# Patient Record
Sex: Female | Born: 1955 | Hispanic: Yes | Marital: Married | Smoking: Never smoker
Health system: Southern US, Community
[De-identification: ages and names within clinical notes are randomized; demographics above are authoritative.]

## PROBLEM LIST (undated history)

## (undated) DIAGNOSIS — E785 Hyperlipidemia, unspecified: Secondary | ICD-10-CM

## (undated) DIAGNOSIS — G43909 Migraine, unspecified, not intractable, without status migrainosus: Secondary | ICD-10-CM

## (undated) HISTORY — DX: Hyperlipidemia, unspecified: E78.5

## (undated) HISTORY — DX: Migraine, unspecified, not intractable, without status migrainosus: G43.909

---

## 2010-12-30 HISTORY — PX: ABDOMINAL HYSTERECTOMY: SHX81

## 2011-06-05 ENCOUNTER — Encounter (HOSPITAL_COMMUNITY): Payer: BC Managed Care – PPO

## 2011-06-05 ENCOUNTER — Other Ambulatory Visit: Payer: Self-pay | Admitting: Obstetrics and Gynecology

## 2011-06-05 LAB — COMPREHENSIVE METABOLIC PANEL
AST: 15 U/L (ref 0–37)
CO2: 28 mEq/L (ref 19–32)
Calcium: 9.2 mg/dL (ref 8.4–10.5)
Creatinine, Ser: 0.63 mg/dL (ref 0.4–1.2)
GFR calc Af Amer: >60 mL/min (ref >60)
GFR calc non Af Amer: >60 mL/min (ref >60)
Sodium: 139 mEq/L (ref 135–145)
Total Protein: 7.1 g/dL (ref 6.0–8.3)

## 2011-06-05 LAB — CBC
MCH: 29.8 pg (ref 26.0–34.0)
MCHC: 31.5 g/dL (ref 30.0–36.0)
Platelets: 323 10*3/uL (ref 150–400)
RDW: 17.2 % — ABNORMAL HIGH (ref 11.5–15.5)

## 2011-06-05 LAB — URINALYSIS, ROUTINE W REFLEX MICROSCOPIC
Bilirubin Urine: NEGATIVE
Glucose, UA: NEGATIVE mg/dL
Nitrite: NEGATIVE
Specific Gravity, Urine: 1.02 (ref 1.005–1.030)
pH: 7 (ref 5.0–8.0)

## 2011-06-05 LAB — SURGICAL PCR SCREEN: MRSA, PCR: NEGATIVE

## 2011-06-11 ENCOUNTER — Inpatient Hospital Stay (HOSPITAL_COMMUNITY)
Admission: RE | Admit: 2011-06-11 | Discharge: 2011-06-13 | DRG: 359 | Disposition: A | Discharge status: Home / Self Care | Payer: BC Managed Care – PPO | Source: Ambulatory Visit | Attending: Obstetrics and Gynecology | Admitting: Obstetrics and Gynecology

## 2011-06-11 ENCOUNTER — Other Ambulatory Visit: Payer: Self-pay | Admitting: Obstetrics and Gynecology

## 2011-06-11 DIAGNOSIS — N8000 Endometriosis of the uterus, unspecified: Secondary | ICD-10-CM | POA: Diagnosis present

## 2011-06-11 DIAGNOSIS — N80209 Endometriosis of unspecified fallopian tube, unspecified depth: Secondary | ICD-10-CM | POA: Diagnosis present

## 2011-06-11 DIAGNOSIS — N840 Polyp of corpus uteri: Secondary | ICD-10-CM | POA: Diagnosis present

## 2011-06-11 DIAGNOSIS — D252 Subserosal leiomyoma of uterus: Principal | ICD-10-CM | POA: Diagnosis present

## 2011-06-11 DIAGNOSIS — N7013 Chronic salpingitis and oophoritis: Secondary | ICD-10-CM | POA: Diagnosis present

## 2011-06-11 DIAGNOSIS — N802 Endometriosis of fallopian tube: Secondary | ICD-10-CM | POA: Diagnosis present

## 2011-06-11 DIAGNOSIS — N920 Excessive and frequent menstruation with regular cycle: Secondary | ICD-10-CM | POA: Diagnosis present

## 2011-06-11 DIAGNOSIS — N393 Stress incontinence (female) (male): Secondary | ICD-10-CM | POA: Diagnosis present

## 2011-06-11 DIAGNOSIS — D251 Intramural leiomyoma of uterus: Secondary | ICD-10-CM | POA: Diagnosis present

## 2011-06-11 LAB — PREGNANCY, URINE: Preg Test, Ur: NEGATIVE

## 2011-06-12 LAB — CBC
Hemoglobin: 11 g/dL — ABNORMAL LOW (ref 12.0–15.0)
MCHC: 31.5 g/dL (ref 30.0–36.0)
RDW: 17.1 % — ABNORMAL HIGH (ref 11.5–15.5)
WBC: 9.7 10*3/uL (ref 4.0–10.5)

## 2011-06-12 LAB — BASIC METABOLIC PANEL
Chloride: 101 mEq/L (ref 96–112)
GFR calc Af Amer: >60 mL/min (ref >60)
GFR calc non Af Amer: >60 mL/min (ref >60)
Potassium: 4.2 mEq/L (ref 3.5–5.1)
Sodium: 136 mEq/L (ref 135–145)

## 2011-06-22 NOTE — Op Note (Signed)
Kim Fitzpatrick, Kim Fitzpatrick NO.:  [id number]  MEDICAL RECORD NO.:  [id number]  LOCATION:  9317                          FACILITY:  [facility]  PHYSICIAN:  Randye Lobo, M.D.   DATE OF BIRTH:  [birth year]  DATE OF PROCEDURE:  06/11/2011 DATE OF DISCHARGE:                              OPERATIVE REPORT   PREOPERATIVE DIAGNOSES: 1. Symptomatic uterine fibroids. 2. Genuine stress incontinence.  POSTOPERATIVE DIAGNOSES: 1. Symptomatic uterine fibroids. 2. Genuine stress incontinence.  PROCEDURES: 1. Total abdominal hysterectomy with bilateral salpingo-oophorectomy. 2. Abdominal Burch procedure with cystoscopy.  SURGEON:  Randye Lobo, MD  ASSISTANT:  Luvenia Redden, MD  ANESTHESIA:  General endotracheal.  IV FLUIDS:  2300 mL Ringer's lactate.  ESTIMATED BLOOD LOSS:  250 mL.  URINE OUTPUT:  750 mL.  COMPLICATIONS:  None.  INDICATIONS FOR PROCEDURE:  The patient is a 55 year old para 2 Hispanic female who presents with heavy menstrual cycles and urinary incontinence.  The patient has known uterine fibroids documented on an MRI from an outside facility.  The patient has had an endometrial biopsy by another provider, documenting an endometrial polyp.  The patient is reporting urinary incontinence with laughing, coughing, and sneezing. Again, she had urodynamics performed at an outside facility.  The patient had planned for a hysterectomy with the urinary incontinence procedure at this location but had to delay in her surgical planning. She now presents, requesting definitive treatment of the uterine fibroids, and she wishes for surgical treatment of her urinary stress incontinence.  A plan is made at this time to proceed with a total abdominal hysterectomy with bilateral salpingo-oophorectomy, abdominal Burch procedure, and cystoscopy after risks, benefits, and alternatives are reviewed.  FINDINGS:  The patient at laparotomy is noted to have a 14 weeks'  size multi-fibroid uterus.  There was a large myoma which extended into the left broad ligament.  The bilateral ovaries were normal and the bilateral fallopian tubes were consistent with distal salpingectomy. There was no evidence of any adhesive disease in the abdomen or pelvis.  The cystoscopy after termination of the Burch procedure demonstrated the absence of a foreign body in the bladder or the urethra.  The bladder was visualized throughout 360 degrees including the bladder dome and trigone.  Exploration of the upper abdomen demonstrated a normal liver, gallbladder, bilateral kidneys, and periaortic region.  The appendix and pelvic lymph nodes were normal.  SPECIMENS:  The uterus, cervix, bilateral ovaries, and proximal fallopian tubes were sent to Pathology.  DESCRIPTION OF PROCEDURE:  The patient was reidentified in the preoperative hold area.  She received cefotetan IV for antibiotic prophylaxis.  She received PAS hose for DVT prophylaxis.  In the operating room, general endotracheal anesthesia was induced and the patient was then placed in the dorsal lithotomy position.  The abdomen and vagina were sterilely prepped and draped.  A 3-way Foley catheter was then placed inside the urethra and left to gravity drainage.  The procedure began by creating a Pfannenstiel incision with a scalpel. The incision was carried down to the fascia using monopolar cautery for hemostasis.  The fascia was incised in the midline and then the incision was extended with the Mayo scissors.  The rectus muscles were dissected off the fascia superiorly and inferiorly.  The rectus muscles were sharply divided in the midline.  The parietal peritoneum was elevated with 2 hemostat clamps and entered sharply.  The peritoneal incision was extended cranially and caudally.  A self-retaining retractor was then placed inside the pelvis and the bowel was packed into the upper abdomen with moistened lap  pad.  An exploration of the pelvic organs was performed.  Kelly clamps were then placed across the adnexal structures bilaterally. The right round ligament was grasped and suture ligated with a transfixing suture of 0 Vicryl.  The round ligament was then sharply divided with monopolar cautery.  The anterior and posterior leaves of the broad ligament were opened with monopolar cautery at this time.  The right ureter was identified.  The right infundibulopelvic ligament was doubly clamped, cut, and then free tied with 2 free ties of 0 Vicryl. The bladder flap was created on the right-hand side.  The uterine artery was skeletonized on the same side.  Attention was then turned to the left round ligament which was grasped with a Babcock clamp and again suture ligated with a transfixing suture of 0 Vicryl.  It was again divided with monopolar cautery.  The anterior and posterior leaves of the broad ligament were opened with monopolar cautery.  The left ureter was identified.  The left infundibulopelvic ligament was now doubly clamped, sharply divided, and ligated with a free tie of 0 Vicryl followed by suture ligature of the same.    The left broad ligament fibroid was then dissected out of the broad ligament, and the bladder flap was created on the patient's left side. Care was taken to continuously identify the left ureter.  The uterine artery could then be skeletonized.  A Heaney clamp was then used to clamp the uterine artery and vein.  This was sharply divided and suture ligated with 0 Vicryl.  Additional pedicles were taken along the cardinal ligament in a similar fashion by clamping, sharply dividing, and suture ligating with 0 Vicryl.  The same procedure was then carried out on the cardinal ligament on the patient's right-hand side.  The bladder was further dissected off the cervix.  Each of the uterosacral ligaments were then clamped, sharply divided, and suture ligated with 0  Vicryl sutures.  Curved Heaney clamps were then placed across the vagina just below the cervix.  The specimen was sharply excised from the vaginal cuff and sent to Pathology.  Angle sutures with transfixing suture of 0 Vicryl were then placed bilaterally.  A separate figure-of-eight suture was placed in the mid vagina for closure of the vaginal cuff.  The pelvis was then irrigated and suctioned at this time.  There was a small amount of bleeding which was noted near the cardinal ligament on the patient's right-hand side.  A superficial figure-of-eight suture of 0 Vicryl was placed for good hemostasis.  Attention was turned at this time to the space of Retzius.  Blunt dissection was used to dissect to the right and left of the urethra into the space of Retzius to expose the endopelvic fascia bilaterally.  There was good hemostasis during this dissection.  The Burch sutures were then placed.  A total of 4 sutures of 0 Ethibond double-armed suture were used.  Attention was turned to the right mid urethra and a figure-of- eight suture of the Ethibond was placed lateral to the right mid urethra.  A second Burch suture was placed in the  endopelvic fascia at the level of the urethrovesical junction on that right-hand side.  The same was then performed on the left-hand side.  This was performed with a gloved hand in the vagina.  Each arm of the sutures was then brought up through the [place] ligaments on their ipsilateral sides.  Gelfoam was placed behind each of the suture bridges and each of the sutures were then tied sequentially while a gloved hand elevated the vagina from below.  There was good elevation and support to the urethra bilaterally.  Cystoscopy was performed next after the Foley catheter was removed.  The patient did receive indigo carmine dye IV first.  Please refer to the findings above.  A Foley catheter was then replaced and left to gravity drainage.  Attention was turned  to the closure of the abdomen.  All operative sites were reexamined and found to be hemostatic at this time.  The self- retaining retractor and the lap pads were removed.  The upper abdomen was explored.  The parietal peritoneum was closed at this time with a running suture of 2-0 Vicryl.  The rectus muscles were reapproximated with interrupted sutures of 0 Vicryl.  The fascia was closed with a running suture of 0 Vicryl.  The subcutaneous layer was closed with interrupted sutures of 2- 0 plain gut suture after irrigation was performed and hemostasis was created with monopolar cautery.  The skin was closed with a subcuticular closure of 4-0 Vicryl and Dermabond was placed over the incision.  This concluded the patient's procedure.  There were no complications. All needle, instrument, and sponge counts were correct.     Randye Lobo, M.D.     BES/MEDQ  D:  06/11/2011  T:  06/12/2011  Job:  [id number]  Electronically Signed by Conley Simmonds M.D. on 06/22/2011 12:32:36 PM

## 2011-07-06 NOTE — Discharge Summary (Signed)
Kim Fitzpatrick, Kim Fitzpatrick NO.:  [id number]  MEDICAL RECORD NO.:  [id number]  LOCATION:  9317                          FACILITY:  [facility]  PHYSICIAN:  Randye Lobo, M.D.   DATE OF BIRTH:  08-Jul-1956  DATE OF ADMISSION:  06/11/2011 DATE OF DISCHARGE:  06/13/2011                              DISCHARGE SUMMARY   ADMISSION DIAGNOSES: 1. Symptomatic uterine fibroids. 2. Genuine stress incontinence.  DISCHARGE DIAGNOSES: 1. Symptomatic uterine fibroids. 2. Genuine stress incontinence. 3. Status post total abdominal hysterectomy with bilateral salpingo-     oophorectomy, abdominal Burch procedure and cystoscopy.  SIGNIFICANT OPERATIONS AND PROCEDURES:  The patient underwent a total abdominal hysterectomy with bilateral salpingo-oophorectomy, abdominal Burch procedure and cystoscopy on June 11, 2011, at the [facility] of [place] under the direction of Dr. Conley Simmonds and with the assistance of Dr. Lodema Hong.ABBREVIATED ADMISSION HISTORY AND PHYSICAL EXAMINATION:  The patient is a 55 year old para 2 Hispanic female who presents with known uterine fibroids and heavy menstrual bleeding and uterine incontinence.  The patient has recently transferred from [place] where she had undergone evaluation for menorrhagia and urinary incontinence.  MRI demonstrated a multi fibroid uterus.  Endometrial biopsy documented an endometrial polyp.  Urodynamics was similarly performed by this outside facility. The patient had to delay her surgical planning there and she has since relocated to [facility] and now requests to proceed with hysterectomy and the urinary incontinence procedure.  On physical examination, her uterus is noted to be 14 weeks' size and nontender.  No adnexal masses nor tenderness were appreciated.  The patient has been counseled for a total abdominal hysterectomy with bilateral salpingo-oophorectomy, abdominal Burch procedure and cystoscopy.  HOSPITAL  COURSE:  The patient was admitted on June 11, 2011, at which time she underwent the intended total abdominal hysterectomy with bilateral salpingo-oophorectomy, abdominal Burch procedure and cystoscopy which were all performed without complication.  The patient had control of her pain on her first postoperative night with a morphine PCA and Toradol.  The patient's PCA was discontinued on postoperative day #1.  The patient continued with her IV fluids until postoperative day #2.  Her diet was slowly advanced to normal during her hospitalization.  The patient began her voiding trials on postop day #1.  She had urinary retention and her Foley catheter therefore needed to be replaced.  The patient's incision on postop day #1 was clean, dry and intact and was covered with Dermabond.  There were some ecchymoses on the left side of the incision, but no evidence of induration.  The patient had very little vaginal bleeding.  The patient's post postoperative day #1 hemoglobin was 11.0.  In my absence on postop day #2, my colleague Dr. Miguel Aschoff saw the patient on hospital rounds.  The patient's Foley catheter was removed and she began her voiding trials again.  The patient was able to ambulate independently.  The patient's final pathology report was pending at the time of her discharge.  The patient was found to be in good condition and ready for discharge on postop day #2.  DISCHARGE INSTRUCTIONS: 1. Discharged to home. 2. The patient will take the following medications Percocet 5 mg/325  mg one to two p.o. q.4-6 h. p.r.n. pain.  Ibuprofen 600 mg p.o. q.6     h. p.r.n. pain.  The patient will take Estrace 1 mg p.o. daily for     hormone therapy. 3. The patient will follow a regular diet. 4. The patient will be discharged to home with a Foley catheter if she     has large postvoid residuals over 100 mL. 5. The patient will have decreased activity.  She will not drive for 2      weeks.  She will not lift anything over 10 pounds or have sexual     activity for the next 6 weeks. 6. The patient will follow up in the office in a few days if she is     discharged to home with a Foley catheter, otherwise she will follow     up in approximately four week's time.  The patient will call if she experiences problems with fever, nausea and vomiting, pain uncontrolled by medication, incisional drainage, redness, heavy vaginal bleeding, difficulty with voiding or with her Foley catheter, or any other concern.     Randye Lobo, M.D.     BES/MEDQ  D:  06/22/2011  T:  06/22/2011  Job:  [id number]  Electronically Signed by Conley Simmonds M.D. on 07/06/2011 08:59:16 AM

## 2012-03-29 ENCOUNTER — Ambulatory Visit (INDEPENDENT_AMBULATORY_CARE_PROVIDER_SITE_OTHER): Payer: BC Managed Care – PPO | Admitting: Physician Assistant

## 2012-03-29 VITALS — BP 130/82 | HR 76 | Temp 99.0°F | Resp 16 | Ht 64.5 in | Wt 151.0 lb

## 2012-03-29 DIAGNOSIS — J40 Bronchitis, not specified as acute or chronic: Secondary | ICD-10-CM

## 2012-03-29 DIAGNOSIS — R05 Cough: Secondary | ICD-10-CM

## 2012-03-29 DIAGNOSIS — R059 Cough, unspecified: Secondary | ICD-10-CM

## 2012-03-29 MED ORDER — IPRATROPIUM BROMIDE 0.06 % NA SOLN: 2.0000 | Freq: Three times a day (TID) | NASAL | Status: DC

## 2012-03-29 MED ORDER — BENZONATATE 100 MG PO CAPS: 100.0000 mg | ORAL_CAPSULE | Freq: Three times a day (TID) | ORAL | Status: AC | PRN

## 2012-03-29 MED ORDER — AZITHROMYCIN 250 MG PO TABS: ORAL_TABLET | ORAL | Status: AC

## 2012-03-29 NOTE — Progress Notes (Signed)
Patient ID: Kim Fitzpatrick MRN: [id number], DOB: 1956/07/16, 56 y.o. Date of Encounter: 03/29/2012, 1:49 PM  Primary Physician: No primary provider on file.  Chief Complaint:  Chief Complaint  Patient presents with  . URI    3 days ago exposed to bronchitis  . Nasal Congestion    HPI: 56 y.o. year old female presents with a 3 day history of nasal congestion, post nasal drip, sore throat, sinus pressure, and cough. Afebrile. No chills. Nasal congestion thick and green/yellow. Cough is productive of green/yellow sputum and not associated with time of day. Ears feel full, leading to sensation of muffled hearing. Has tried OTC cold preps without success. No GI complaints. Appetite normal. Husband sick with similar symptoms.  No recent antibiotics, or recent travels.   No leg trauma, sedentary periods, h/o cancer, or tobacco use.  Past Medical History  Diagnosis Date  . Allergic rhinitis   . Migraines      Home Meds: Prior to Admission medications   Medication Sig Start Date End Date Taking? Authorizing Provider  estradiol (ESTRACE) 1 MG tablet Take 1 mg by mouth daily.   Yes Historical Provider, MD  azithromycin (ZITHROMAX Z-PAK) 250 MG tablet 2 tabs po first day, then 1 tab po next 4 days 03/29/12 04/03/12  Raymon Mutton Aaleigha Bozza, PA-C  benzonatate (TESSALON PERLES) 100 MG capsule Take 1 capsule (100 mg total) by mouth 3 (three) times daily as needed for cough. 03/29/12 04/05/12  Raymon Mutton Anneliese Leblond, PA-C  ipratropium (ATROVENT) 0.06 % nasal spray Place 2 sprays into the nose 3 (three) times daily. 03/29/12 03/29/13  Sondra Barges, PA-C    Allergies: No Known Allergies  History   Social History  . Marital Status: Married    Spouse Name: N/A    Number of Children: N/A  . Years of Education: N/A   Occupational History  . Not on file.   Social History Main Topics  . Smoking status: Never Smoker   . Smokeless tobacco: Not on file  . Alcohol Use: Not on file  . Drug Use: Not on file  .  Sexually Active: Not on file   Other Topics Concern  . Not on file   Social History Narrative  . No narrative on file     Review of Systems: Constitutional: negative for chills, fever, night sweats or weight changes Cardiovascular: negative for chest pain or palpitations Respiratory: negative for hemoptysis, wheezing, or shortness of breath Abdominal: negative for abdominal pain, nausea, vomiting or diarrhea Dermatological: negative for rash Neurologic: negative for headache   Physical Exam: Blood pressure 130/82, pulse 76, temperature 99 F (37.2 C), temperature source Oral, resp. rate 16, height 5' 4.5" (1.638 m), weight 151 lb (68.493 kg)., Body mass index is 25.52 kg/(m^2). General: Well developed, well nourished, in no acute distress. Head: Normocephalic, atraumatic, eyes without discharge, sclera non-icteric, nares are congested. Bilateral auditory canals clear, TM's are without perforation, pearly grey with reflective cone of light bilaterally. Serous effusion bilaterally behind TM's. Maxillary sinus TTP. Oral cavity moist, dentition normal. Posterior pharynx with post nasal drip and mild erythema. No peritonsillar abscess or tonsillar exudate. Neck: Supple. No thyromegaly. Full ROM. No lymphadenopathy. Lungs: Coarse breath sounds bilaterally without wheezes, rales, or rhonchi. Breathing is unlabored.  Heart: RRR with S1 S2. No murmurs, rubs, or gallops appreciated. Msk:  Strength and tone normal for age. Extremities: No clubbing or cyanosis. No edema. Neuro: Alert and oriented X 3. Moves all extremities spontaneously. CNII-XII grossly  in tact. Psych:  Responds to questions appropriately with a normal affect.     ASSESSMENT AND PLAN:  56 y.o. year old female with sinobronchitis -Azithromycin 250 MG #6 2 po first day then 1 po next 4 days no RF -Atrovent NS 0.06% 2 sprays each nare bid prn #1 no RF -Tessalon Perles 100 mg 1 po tid prn cough #30 no  RF  -Mucinex -Tylenol/Motrin prn -Rest/fluids -RTC precautions -RTC 3-5 days if no improvement  Signed, Eula Listen, PA-C 03/29/2012 1:49 PM

## 2013-04-10 ENCOUNTER — Encounter: Payer: Self-pay | Admitting: Family Medicine

## 2013-04-10 DIAGNOSIS — E785 Hyperlipidemia, unspecified: Secondary | ICD-10-CM | POA: Insufficient documentation

## 2013-04-13 ENCOUNTER — Encounter: Payer: Self-pay | Admitting: Family Medicine

## 2013-04-13 ENCOUNTER — Ambulatory Visit (INDEPENDENT_AMBULATORY_CARE_PROVIDER_SITE_OTHER): Payer: BC Managed Care – PPO | Admitting: Family Medicine

## 2013-04-13 VITALS — BP 130/86 | HR 78 | Temp 98.7°F | Resp 14 | Ht 63.0 in | Wt 142.0 lb

## 2013-04-13 DIAGNOSIS — D2262 Melanocytic nevi of left upper limb, including shoulder: Secondary | ICD-10-CM

## 2013-04-13 DIAGNOSIS — D2360 Other benign neoplasm of skin of unspecified upper limb, including shoulder: Secondary | ICD-10-CM

## 2013-04-13 DIAGNOSIS — E785 Hyperlipidemia, unspecified: Secondary | ICD-10-CM

## 2013-04-13 DIAGNOSIS — D489 Neoplasm of uncertain behavior, unspecified: Secondary | ICD-10-CM

## 2013-04-13 DIAGNOSIS — M545 Low back pain: Secondary | ICD-10-CM

## 2013-04-13 DIAGNOSIS — Z0000 Encounter for general adult medical examination without abnormal findings: Secondary | ICD-10-CM

## 2013-04-13 NOTE — Progress Notes (Signed)
Subjective:    Patient ID: Kim Fitzpatrick, female    DOB: 1956/01/23, 57 y.o.   MRN: [id number]  HPI Patient is here for complete physical exam. She gets her pelvic exam, breast exam, and mammogram through her gynecologist Dr. Edward Jolly.  In January we started her on pravastatin 40 mg by mouth daily due to an LDL of 168. Her goal LDL is less than 130. She is due for a fasting lipid panel later this month.  Denies any myalgias, right upper quadrant pain on med.  She reports several weeks of left-sided low back pain. It is worsened by sitting or lying down. It improves with movement. She describes the pain is a tightening sensation. She denies any sciatica, dysesthesias, or paresthesias radiating into her legs. She denies any specific injury to her lower back..  She also has a the dorsum of her left wrist she has not noticed before. It is dark in color with irregular borders. It is different from the other moles on her body.  Denies any family history of melanoma.  Past Medical History  Diagnosis Date  . Allergic rhinitis   . Migraines   . Hyperlipidemia    Current Outpatient Prescriptions on File Prior to Visit  Medication Sig Dispense Refill  . estradiol (ESTRACE) 1 MG tablet Take 1 mg by mouth daily.      . pravastatin (PRAVACHOL) 40 MG tablet Take 40 mg by mouth at bedtime.      Marland Kitchen ipratropium (ATROVENT) 0.06 % nasal spray Place 2 sprays into the nose 3 (three) times daily.  15 mL  0   No current facility-administered medications on file prior to visit.   No Known Allergies History   Social History  . Marital Status: Married    Spouse Name: N/A    Number of Children: N/A  . Years of Education: N/A   Occupational History  . Not on file.   Social History Main Topics  . Smoking status: Never Smoker   . Smokeless tobacco: Never Used  . Alcohol Use: No  . Drug Use: No  . Sexually Active: Yes     Comment: married to [place]   Other Topics Concern  . Not on file   Social  History Narrative  . No narrative on file   Family History  Problem Relation Age of Onset  . Osteoporosis Mother   . Hyperlipidemia Mother   . Hypertension Father        Review of Systems  Constitutional: Negative.   HENT: Negative.   Eyes: Negative.   Respiratory: Negative.   Cardiovascular: Negative.   Gastrointestinal: Negative.   Endocrine: Negative.   Genitourinary: Negative.   Musculoskeletal: Positive for back pain.  Skin: Negative.   Allergic/Immunologic: Negative.   Neurological: Negative.   Hematological: Negative.   Psychiatric/Behavioral: Negative.        Objective:   Physical Exam  Constitutional: She is oriented to person, place, and time. She appears well-developed and well-nourished. No distress.  HENT:  Head: Normocephalic and atraumatic.  Right Ear: External ear normal.  Left Ear: External ear normal.  Mouth/Throat: Oropharynx is clear and moist.  Eyes: Conjunctivae and EOM are normal. Pupils are equal, round, and reactive to light. Right eye exhibits no discharge. Left eye exhibits no discharge. No scleral icterus.  Neck: Normal range of motion. Neck supple. No JVD present. No tracheal deviation present. No thyromegaly present.  Cardiovascular: Normal rate, regular rhythm, normal heart sounds and intact distal pulses.  Exam  reveals no gallop and no friction rub.   No murmur heard. Pulmonary/Chest: Effort normal and breath sounds normal. No stridor. No respiratory distress. She has no wheezes. She has no rales. She exhibits no tenderness.  Abdominal: Soft. Bowel sounds are normal. She exhibits no distension and no mass. There is no tenderness. There is no rebound and no guarding.  Musculoskeletal: Normal range of motion. She exhibits tenderness (tender in left paraspinal muscles.). She exhibits no edema.  Lymphadenopathy:    She has no cervical adenopathy.  Neurological: She is alert and oriented to person, place, and time. She has normal reflexes.  She displays normal reflexes. No cranial nerve deficit. She exhibits normal muscle tone. Coordination normal.  Skin: Skin is warm and dry. Rash noted. She is not diaphoretic. No erythema. No pallor.  Psychiatric: She has a normal mood and affect. Her behavior is normal. Judgment and thought content normal.   2 mm dark black multiple the left wrist        Assessment & Plan:  1. Nevus of wrist, left After obtaining informed consent, the mole on the dorsum of the left wrist was anesthetized with 0.1% lidocaine with epinephrine. Shave biopsy was performed and the lesion was sent to pathology. - Pathology Report  2. HLD (hyperlipidemia) Go LDL is less than 1:30 I asked the patient to return in 2 weeks for fasting lipid panel. - Basic Metabolic Panel; Future - Hepatic Function Panel; Future - Lipid Panel; Future  3. Routine general medical examination at a health care facility The remainder of her exam is entirely normal. I will defer her breast, pelvic, and mammogram to her gynecologist per the patient request.  4. Low back pain Suspect muscle strain, advise the patient to use Valium 5 mg by mouth each bedtime for muscle spasms. Provided her with a home physical therapy handout for stretches of her lower back. If it is no better in 2 weeks recommend formal physical therapy.

## 2013-04-15 ENCOUNTER — Encounter: Payer: Self-pay | Admitting: Family Medicine

## 2013-04-15 LAB — PATHOLOGY

## 2013-04-15 NOTE — Progress Notes (Signed)
  Subjective:    Patient ID: Kim Fitzpatrick, female    DOB: 10-04-1956, 57 y.o.   MRN: [id number]  HPI  Tell patient her biopsy showed actinic keratosis which is benign sun damage.  Review of Systems     Objective:   Physical Exam        Assessment & Plan:  Legrand Rams called patient and told her this information.

## 2013-05-05 ENCOUNTER — Other Ambulatory Visit (INDEPENDENT_AMBULATORY_CARE_PROVIDER_SITE_OTHER): Payer: BC Managed Care – PPO

## 2013-05-05 DIAGNOSIS — E785 Hyperlipidemia, unspecified: Secondary | ICD-10-CM

## 2013-05-05 DIAGNOSIS — Z0000 Encounter for general adult medical examination without abnormal findings: Secondary | ICD-10-CM

## 2013-05-05 LAB — HEPATIC FUNCTION PANEL
Alkaline Phosphatase: 83 U/L (ref 39–117)
Bilirubin, Direct: 0.1 mg/dL (ref 0.0–0.3)
Indirect Bilirubin: 0.6 mg/dL (ref 0.0–0.9)
Total Protein: 7.3 g/dL (ref 6.0–8.3)

## 2013-05-05 LAB — BASIC METABOLIC PANEL
CO2: 26 mEq/L (ref 19–32)
Calcium: 9.9 mg/dL (ref 8.4–10.5)
Sodium: 145 mEq/L (ref 135–145)

## 2013-05-05 LAB — LIPID PANEL
HDL: 69 mg/dL (ref >39)
LDL Cholesterol: 91 mg/dL (ref 0–99)
Total CHOL/HDL Ratio: 2.7 Ratio
Triglycerides: 117 mg/dL (ref <150)
VLDL: 23 mg/dL (ref 0–40)

## 2013-05-10 ENCOUNTER — Encounter: Payer: Self-pay | Admitting: Family Medicine

## 2013-06-08 ENCOUNTER — Telehealth: Payer: Self-pay | Admitting: Family Medicine

## 2013-06-09 MED ORDER — PRAVASTATIN SODIUM 40 MG PO TABS: 40.0000 mg | ORAL_TABLET | Freq: Every day | ORAL | Status: DC

## 2013-06-09 NOTE — Telephone Encounter (Signed)
Rx Refilled  

## 2013-06-29 ENCOUNTER — Other Ambulatory Visit: Payer: Self-pay

## 2013-07-13 ENCOUNTER — Ambulatory Visit
Admission: RE | Admit: 2013-07-13 | Discharge: 2013-07-13 | Disposition: A | Discharge status: Home / Self Care | Payer: BC Managed Care – PPO | Source: Ambulatory Visit | Attending: Family Medicine | Admitting: Family Medicine

## 2013-07-13 ENCOUNTER — Encounter: Payer: Self-pay | Admitting: Family Medicine

## 2013-07-13 ENCOUNTER — Ambulatory Visit (INDEPENDENT_AMBULATORY_CARE_PROVIDER_SITE_OTHER): Payer: BC Managed Care – PPO | Admitting: Family Medicine

## 2013-07-13 VITALS — BP 118/80 | HR 80 | Temp 99.0°F | Resp 16 | Wt 143.5 lb

## 2013-07-13 DIAGNOSIS — M545 Low back pain, unspecified: Secondary | ICD-10-CM

## 2013-07-13 MED ORDER — TRAMADOL HCL 50 MG PO TABS: 50.0000 mg | ORAL_TABLET | Freq: Three times a day (TID) | ORAL | Status: DC | PRN

## 2013-07-13 MED ORDER — CYCLOBENZAPRINE HCL 10 MG PO TABS: 10.0000 mg | ORAL_TABLET | Freq: Two times a day (BID) | ORAL | Status: DC | PRN

## 2013-07-13 MED ORDER — MELOXICAM 7.5 MG PO TABS: 7.5000 mg | ORAL_TABLET | Freq: Every day | ORAL | Status: DC

## 2013-07-13 NOTE — Assessment & Plan Note (Signed)
No radicular symptoms,likley MSK pain.  Xray negative  Will prescribe flexeril, ultram at bedtime and meloxicam daily  Continue HEP No improvement send to PT

## 2013-07-13 NOTE — Patient Instructions (Signed)
Get the xray of your back  Take the flexeril muscle relaxant twice a day as needed Take the ultram which is for pain- at bedtime Take the meloxicam once a day instead of aleve  I will call results

## 2013-07-13 NOTE — Progress Notes (Signed)
  Subjective:    Patient ID: Kim Fitzpatrick, female    DOB: [birth year], 57 y.o.   MRN: [id number]  HPI  Left lower back pain for past 2-3 months, seen by PCP in April, given valium, has also been taking aleve which helps the pain but it returns. Unable to sleep at night, has aching pain in lower back, non radiating, no change in bowel or bladder. She has been doing home exercises as prescribed with minimal improvement. Worse after sitting or standing long periods of time. No specific injury  Review of Systems - per above  GEN- denies fatigue, fever, weight loss,weakness, recent illness GU- denies dysuria, hematuria, dribbling, incontinence MSK- +joint pain,+ muscle aches, injury        Objective:   Physical Exam GEN-NAD,alert and oriented x 3 MSK- Spine NT Back- TTP left paraspinals, neg SLR, +pain with flexion, normal lateral rotation Hip- FROM bilat Neuro- CNII-XII in tact, normal tone, DTR symmetric, motor equal bilat, strength equal bilat LE, sensation in tact        Assessment & Plan:

## 2013-11-29 ENCOUNTER — Ambulatory Visit (INDEPENDENT_AMBULATORY_CARE_PROVIDER_SITE_OTHER): Payer: BC Managed Care – PPO | Admitting: Physician Assistant

## 2013-11-29 ENCOUNTER — Encounter: Payer: Self-pay | Admitting: Physician Assistant

## 2013-11-29 VITALS — BP 132/86 | HR 88 | Temp 98.6°F | Resp 20 | Ht 63.0 in | Wt 144.0 lb

## 2013-11-29 DIAGNOSIS — J988 Other specified respiratory disorders: Secondary | ICD-10-CM

## 2013-11-29 DIAGNOSIS — A499 Bacterial infection, unspecified: Secondary | ICD-10-CM

## 2013-11-29 MED ORDER — HYDROCOD POLST-CHLORPHEN POLST 10-8 MG/5ML PO LQCR: 5.0000 mL | Freq: Two times a day (BID) | ORAL | Status: DC | PRN

## 2013-11-29 MED ORDER — AZITHROMYCIN 250 MG PO TABS: ORAL_TABLET | ORAL | Status: DC

## 2013-11-29 NOTE — Progress Notes (Signed)
Patient ID: Kim Fitzpatrick MRN: [id number], DOB: [birth year], 57 y.o. Date of Encounter: 11/29/2013, 2:51 PM    Chief Complaint:  Chief Complaint  Patient presents with  . hacky cough x 2 weeks     HPI: 57 y.o. year old female says that she recently was keeping her grandchildren.S He says that they were sick with cough and nasal congestion. Patient has now been sick for one week. Says that she has a bad cough. Says that she is getting no sleep at night secondary to the cough. She is a grocery bag full of medications that she has been taking. There are about 6 over-the-counter medications. She is blowing very little from her nose. Says it is mostly all cough. No significant sore throat. No ear pain. No fevers or chills.     Home Meds: See attached medication section for any medications that were entered at today's visit. The computer does not put those onto this list.The following list is a list of meds entered prior to today's visit.   Current Outpatient Prescriptions on File Prior to Visit  Medication Sig Dispense Refill  . pravastatin (PRAVACHOL) 40 MG tablet Take 1 tablet (40 mg total) by mouth at bedtime.  30 tablet  5  . cyclobenzaprine (FLEXERIL) 10 MG tablet Take 1 tablet (10 mg total) by mouth 2 (two) times daily as needed for muscle spasms.  30 tablet  1  . estradiol (ESTRACE) 1 MG tablet Take 1 mg by mouth daily.      Marland Kitchen ipratropium (ATROVENT) 0.06 % nasal spray Place 2 sprays into the nose 3 (three) times daily.  15 mL  0  . meloxicam (MOBIC) 7.5 MG tablet Take 1 tablet (7.5 mg total) by mouth daily.  30 tablet  0  . traMADol (ULTRAM) 50 MG tablet Take 1 tablet (50 mg total) by mouth every 8 (eight) hours as needed for pain.  30 tablet  2   No current facility-administered medications on file prior to visit.    Allergies: No Known Allergies    Review of Systems: See HPI for pertinent ROS. All other ROS negative.    Physical Exam: Blood pressure 132/86, pulse 88,  temperature 98.6 F (37 C), temperature source Oral, resp. rate 20, height 5\' 3"  (1.6 m), weight 144 lb (65.318 kg)., Body mass index is 25.51 kg/(m^2). General: WNWD WF. Appears in no acute distress. HEENT: Normocephalic, atraumatic, eyes without discharge, sclera non-icteric, nares are without discharge. Bilateral auditory canals clear, TM's are without perforation, pearly grey and translucent with reflective cone of light bilaterally. Oral cavity moist, posterior pharynx without exudate, erythema, peritonsillar abscess, or post nasal drip.  Neck: Supple. No thyromegaly. No lymphadenopathy. Lungs: Clear bilaterally to auscultation without wheezes, rales, or rhonchi. Breathing is unlabored. Heart: Regular rhythm. No murmurs, rubs, or gallops. Msk:  Strength and tone normal for age. Extremities/Skin: Warm and dry. No clubbing or cyanosis. No edema. No rashes or suspicious lesions. Neuro: Alert and oriented X 3. Moves all extremities spontaneously. Gait is normal. CNII-XII grossly in tact. Psych:  Responds to questions appropriately with a normal affect.     ASSESSMENT AND PLAN:  57 y.o. year old female with  1. Bacterial respiratory infection - azithromycin (ZITHROMAX) 250 MG tablet; Day 1: Take 2 daily.   Days 2-5: Take 1 daily.  Dispense: 6 tablet; Refill: 0 - chlorpheniramine-HYDROcodone (TUSSIONEX) 10-8 MG/5ML LQCR; Take 5 mLs by mouth every 12 (twelve) hours as needed for cough.  Dispense: 120  mL; Refill: 0 Followup if symptoms do not resolve after completion of the Zpack.  Murray Hodgkins [place], [place], [facility] 11/29/2013 2:51 PM

## 2013-12-01 ENCOUNTER — Ambulatory Visit: Payer: BC Managed Care – PPO | Admitting: Physician Assistant

## 2013-12-13 ENCOUNTER — Telehealth: Payer: Self-pay | Admitting: Family Medicine

## 2013-12-13 DIAGNOSIS — J988 Other specified respiratory disorders: Secondary | ICD-10-CM

## 2013-12-13 NOTE — Telephone Encounter (Signed)
Pt is wanting to know if she can have a refill on her cough medication Call back number is [phone]

## 2013-12-13 NOTE — Telephone Encounter (Signed)
I reviewed patient's chart. She saw me on 11/29/13. I prescribed a Z-Pak as well as Tussionex. I called and spoke with patient. She was coughing repeatedly throughout our entire conversation. I told her that I felt that she needs another antibiotic as well as a cough suppressant. I told her to make sure to complete all of this antibiotic and that if the cough does not resolve after completion of this antibiotic , she needs to come back in and be seen and evaluated to determine if there is another cause for her cough. I am calling in a prescription for Levaquin 750 mg 1 by mouth daily x7 days #7 with 0 refills Also calling in prescription for Tussionex 1 teaspoon every 12 hours when necessary cough suppressant -100 mL to 0 refills. These are called to [facility].

## 2013-12-14 MED ORDER — LEVOFLOXACIN 750 MG PO TABS: 750.0000 mg | ORAL_TABLET | Freq: Every day | ORAL | Status: DC

## 2013-12-14 MED ORDER — HYDROCOD POLST-CHLORPHEN POLST 10-8 MG/5ML PO LQCR: 5.0000 mL | Freq: Two times a day (BID) | ORAL | Status: DC | PRN

## 2013-12-14 NOTE — Telephone Encounter (Signed)
Phoned in meds added to med list

## 2013-12-15 ENCOUNTER — Telehealth: Payer: Self-pay | Admitting: Family Medicine

## 2013-12-15 ENCOUNTER — Encounter: Payer: Self-pay | Admitting: Family Medicine

## 2013-12-15 DIAGNOSIS — J988 Other specified respiratory disorders: Secondary | ICD-10-CM

## 2013-12-15 MED ORDER — HYDROCOD POLST-CHLORPHEN POLST 10-8 MG/5ML PO LQCR: 5.0000 mL | Freq: Two times a day (BID) | ORAL | Status: DC | PRN

## 2013-12-15 NOTE — Telephone Encounter (Signed)
Pt husband is calling back again to speak to someone still same call back number

## 2013-12-15 NOTE — Telephone Encounter (Signed)
This encounter was created in error - please disregard.

## 2013-12-15 NOTE — Telephone Encounter (Signed)
Pt husband is calling because his wife was seen the other day and she is not doing any better with her cough and they are wanting to know if they can get a refill on the cough medicine Call back number is [phone]

## 2013-12-15 NOTE — Telephone Encounter (Signed)
Looks like you already have taken care of this can you call and let them know what was done for her

## 2013-12-17 NOTE — Telephone Encounter (Signed)
Left mess with spouse.  New antibiotic and refill Tussionex was done by provider on 12/17?  Told spouse if still questions call back.

## 2013-12-21 ENCOUNTER — Other Ambulatory Visit: Payer: Self-pay | Admitting: Family Medicine

## 2014-01-12 ENCOUNTER — Other Ambulatory Visit: Payer: Self-pay | Admitting: Family Medicine

## 2014-01-12 MED ORDER — PRAVASTATIN SODIUM 40 MG PO TABS: ORAL_TABLET | ORAL | Status: DC

## 2014-01-21 ENCOUNTER — Telehealth: Payer: Self-pay | Admitting: Family Medicine

## 2014-01-21 DIAGNOSIS — J988 Other specified respiratory disorders: Secondary | ICD-10-CM

## 2014-01-21 DIAGNOSIS — B9689 Other specified bacterial agents as the cause of diseases classified elsewhere: Principal | ICD-10-CM

## 2014-01-21 NOTE — Telephone Encounter (Signed)
Pt is needing a refill on her cough medication, she is going to [place] for a month to visit her mother and she is still having her cough  [facility] back number is [phone], [phone]

## 2014-01-24 MED ORDER — HYDROCOD POLST-CHLORPHEN POLST 10-8 MG/5ML PO LQCR: 5.0000 mL | Freq: Two times a day (BID) | ORAL | Status: DC | PRN

## 2014-01-24 NOTE — Telephone Encounter (Signed)
Ok with hycodan 5 ml po q 6 hrs prn 4 oz, ntbs if persists.

## 2014-01-24 NOTE — Telephone Encounter (Signed)
RX printed, left up front and patient aware to pick up and if cough persists ntbs

## 2014-02-28 ENCOUNTER — Telehealth: Payer: Self-pay | Admitting: Family Medicine

## 2014-02-28 NOTE — Telephone Encounter (Signed)
Called pt husband to get a clarification on what he is referring to, he states that pt has a persistant cough and cough medicine isnt touching it anymore, wants to know if there some one that you can refer her to to help fix the cough and to make sure that we are not missing anything else.Please advise!

## 2014-02-28 NOTE — Telephone Encounter (Signed)
Husband aware of message and has appt on 03/07/14 for CPE

## 2014-02-28 NOTE — Telephone Encounter (Signed)
I need to see the patient.  She was seen here once in 12/14 by MBD.  She has called asking for a refill on cough medicine, but I have not seen her.  Let me see her to determine what is the next course of action or possibly help the cough myself.  Most likely sources are whooping cough, GERD, post nasal drip, chronic sinusitis.  Seeing the patient will help me determine the best treatment.

## 2014-02-28 NOTE — Telephone Encounter (Signed)
Pt is wanting a referral for her cough they are wanting to go to a specialist like a ENT and her husband states she has been her twice for her cough and has been giving same cough medicine and it is not going away Call back number is [phone]

## 2014-03-02 ENCOUNTER — Other Ambulatory Visit: Payer: BC Managed Care – PPO

## 2014-03-02 DIAGNOSIS — Z0000 Encounter for general adult medical examination without abnormal findings: Secondary | ICD-10-CM

## 2014-03-02 LAB — CBC WITH DIFFERENTIAL/PLATELET
Basophils Absolute: 0 10*3/uL (ref 0.0–0.1)
Basophils Relative: 0 % (ref 0–1)
EOS ABS: 0.3 10*3/uL (ref 0.0–0.7)
EOS PCT: 5 % (ref 0–5)
HCT: 42.9 % (ref 36.0–46.0)
HEMOGLOBIN: 14.3 g/dL (ref 12.0–15.0)
LYMPHS ABS: 2.1 10*3/uL (ref 0.7–4.0)
Lymphocytes Relative: 33 % (ref 12–46)
MCH: 32.6 pg (ref 26.0–34.0)
MCHC: 33.3 g/dL (ref 30.0–36.0)
MCV: 97.7 fL (ref 78.0–100.0)
MONO ABS: 0.3 10*3/uL (ref 0.1–1.0)
MONOS PCT: 5 % (ref 3–12)
Neutro Abs: 3.6 10*3/uL (ref 1.7–7.7)
Neutrophils Relative %: 57 % (ref 43–77)
Platelets: 303 10*3/uL (ref 150–400)
RBC: 4.39 MIL/uL (ref 3.87–5.11)
RDW: 13.3 % (ref 11.5–15.5)
WBC: 6.3 10*3/uL (ref 4.0–10.5)

## 2014-03-02 LAB — COMPREHENSIVE METABOLIC PANEL
ALT: 10 U/L (ref 0–35)
AST: 18 U/L (ref 0–37)
Albumin: 4.6 g/dL (ref 3.5–5.2)
Alkaline Phosphatase: 70 U/L (ref 39–117)
BILIRUBIN TOTAL: 0.6 mg/dL (ref 0.2–1.2)
BUN: 17 mg/dL (ref 6–23)
CO2: 27 meq/L (ref 19–32)
CREATININE: 0.57 mg/dL (ref 0.50–1.10)
Calcium: 9.8 mg/dL (ref 8.4–10.5)
Chloride: 106 mEq/L (ref 96–112)
GLUCOSE: 86 mg/dL (ref 70–99)
Potassium: 4.4 mEq/L (ref 3.5–5.3)
SODIUM: 142 meq/L (ref 135–145)
TOTAL PROTEIN: 6.9 g/dL (ref 6.0–8.3)

## 2014-03-02 LAB — TSH: TSH: 1.117 u[IU]/mL (ref 0.350–4.500)

## 2014-03-02 LAB — LIPID PANEL
CHOLESTEROL: 180 mg/dL (ref 0–200)
HDL: 66 mg/dL (ref >39)
LDL Cholesterol: 89 mg/dL (ref 0–99)
TRIGLYCERIDES: 126 mg/dL (ref <150)
Total CHOL/HDL Ratio: 2.7 Ratio
VLDL: 25 mg/dL (ref 0–40)

## 2014-03-07 ENCOUNTER — Encounter: Payer: Self-pay | Admitting: Family Medicine

## 2014-03-07 ENCOUNTER — Ambulatory Visit (INDEPENDENT_AMBULATORY_CARE_PROVIDER_SITE_OTHER): Payer: BC Managed Care – PPO | Admitting: Family Medicine

## 2014-03-07 VITALS — BP 130/88 | HR 98 | Temp 98.0°F | Resp 14 | Ht 63.0 in | Wt 147.0 lb

## 2014-03-07 DIAGNOSIS — R000 Tachycardia, unspecified: Secondary | ICD-10-CM

## 2014-03-07 DIAGNOSIS — R05 Cough: Secondary | ICD-10-CM

## 2014-03-07 DIAGNOSIS — Z0000 Encounter for general adult medical examination without abnormal findings: Secondary | ICD-10-CM

## 2014-03-07 DIAGNOSIS — R053 Chronic cough: Secondary | ICD-10-CM

## 2014-03-07 DIAGNOSIS — R059 Cough, unspecified: Secondary | ICD-10-CM

## 2014-03-07 MED ORDER — FLUTICASONE PROPIONATE 50 MCG/ACT NA SUSP: 2.0000 | Freq: Every day | NASAL | Status: DC

## 2014-03-07 MED ORDER — GUAIFENESIN-CODEINE 100-10 MG/5ML PO SOLN: 5.0000 mL | Freq: Three times a day (TID) | ORAL | Status: DC

## 2014-03-07 MED ORDER — HYDROCODONE-HOMATROPINE 5-1.5 MG/5ML PO SYRP: 5.0000 mL | ORAL_SOLUTION | Freq: Three times a day (TID) | ORAL | Status: DC | PRN

## 2014-03-07 MED ORDER — ESOMEPRAZOLE MAGNESIUM 40 MG PO CPDR: 40.0000 mg | DELAYED_RELEASE_CAPSULE | Freq: Every day | ORAL | Status: DC

## 2014-03-07 NOTE — Addendum Note (Signed)
Addended by: Shary Decamp B on: 03/07/2014 12:50 PM   Modules accepted: Orders

## 2014-03-07 NOTE — Progress Notes (Signed)
Subjective:    Patient ID: Kim Fitzpatrick, female    DOB: 08/31/1956, 58 y.o.   MRN: [id number]  HPI Patient is here today for a complete physical exam. She sees a gynecologist who performed her Pap smear as well as her breast exam and mammograms. Her most recent lab work is listed below: Lab on 03/02/2014  Component Date Value Ref Range Status  . WBC 03/02/2014 6.3  4.0 - 10.5 K/uL Final  . RBC 03/02/2014 4.39  3.87 - 5.11 MIL/uL Final  . Hemoglobin 03/02/2014 14.3  12.0 - 15.0 g/dL Final  . HCT 03/02/2014 42.9  36.0 - 46.0 % Final  . MCV 03/02/2014 97.7  78.0 - 100.0 fL Final  . MCH 03/02/2014 32.6  26.0 - 34.0 pg Final  . MCHC 03/02/2014 33.3  30.0 - 36.0 g/dL Final  . RDW 03/02/2014 13.3  11.5 - 15.5 % Final  . Platelets 03/02/2014 303  150 - 400 K/uL Final  . Neutrophils Relative % 03/02/2014 57  43 - 77 % Final  . Neutro Abs 03/02/2014 3.6  1.7 - 7.7 K/uL Final  . Lymphocytes Relative 03/02/2014 33  12 - 46 % Final  . Lymphs Abs 03/02/2014 2.1  0.7 - 4.0 K/uL Final  . Monocytes Relative 03/02/2014 5  3 - 12 % Final  . Monocytes Absolute 03/02/2014 0.3  0.1 - 1.0 K/uL Final  . Eosinophils Relative 03/02/2014 5  0 - 5 % Final  . Eosinophils Absolute 03/02/2014 0.3  0.0 - 0.7 K/uL Final  . Basophils Relative 03/02/2014 0  0 - 1 % Final  . Basophils Absolute 03/02/2014 0.0  0.0 - 0.1 K/uL Final  . Smear Review 03/02/2014 Criteria for review not met   Final  . Sodium 03/02/2014 142  135 - 145 mEq/L Final  . Potassium 03/02/2014 4.4  3.5 - 5.3 mEq/L Final  . Chloride 03/02/2014 106  96 - 112 mEq/L Final  . CO2 03/02/2014 27  19 - 32 mEq/L Final  . Glucose, Bld 03/02/2014 86  70 - 99 mg/dL Final  . BUN 03/02/2014 17  6 - 23 mg/dL Final  . Creat 03/02/2014 0.57  0.50 - 1.10 mg/dL Final  . Total Bilirubin 03/02/2014 0.6  0.2 - 1.2 mg/dL Final  . Alkaline Phosphatase 03/02/2014 70  39 - 117 U/L Final  . AST 03/02/2014 18  0 - 37 U/L Final  . ALT 03/02/2014 10  0 - 35 U/L Final   . Total Protein 03/02/2014 6.9  6.0 - 8.3 g/dL Final  . Albumin 03/02/2014 4.6  3.5 - 5.2 g/dL Final  . Calcium 03/02/2014 9.8  8.4 - 10.5 mg/dL Final  . Cholesterol 03/02/2014 180  0 - 200 mg/dL Final   Comment: ATP III Classification:                                < 200        mg/dL        Desirable                               200 - 239     mg/dL        Borderline High                               >=  240        mg/dL        High                             . Triglycerides 03/02/2014 126  <150 mg/dL Final  . HDL 03/02/2014 66  >39 mg/dL Final  . Total CHOL/HDL Ratio 03/02/2014 2.7   Final  . VLDL 03/02/2014 25  0 - 40 mg/dL Final  . LDL Cholesterol 03/02/2014 89  0 - 99 mg/dL Final   Comment:                            Total Cholesterol/HDL Ratio:CHD Risk                                                 Coronary Heart Disease Risk Table                                                                 Men       Women                                   1/2 Average Risk              3.4        3.3                                       Average Risk              5.0        4.4                                    2X Average Risk              9.6        7.1                                    3X Average Risk             23.4       11.0                          Use the calculated Patient Ratio above and the CHD Risk table                           to determine the patient's CHD Risk.                          ATP III Classification (LDL):                                <  100        mg/dL         Optimal                               100 - 129     mg/dL         Near or Above Optimal                               130 - 159     mg/dL         Borderline High                               160 - 189     mg/dL         High                                > 190        mg/dL         Very High                             . TSH 03/02/2014 1.117  0.350 - 4.500 uIU/mL Final   Other blood work is  outstanding, the patient has had a persistent dry nonproductive cough for several months. He began in November. She was treated with antibiotics. A chest x-ray was normal. She does continue to experience the cough now unrelenting for 4 months. His be worse at night when she lies down. She does report some postnasal drip and sinus congestion. She denies any acid reflux. She denies any exposure to whooping cough or tuberculosis. She denies any fevers or chills or hemoptysis. He denies any shortness of breath. The coughing comes in spells and spasms. When it happened she can barely catch her breath. Today on examination after coughing she hasn't significant tachycardia to 118 beats per minute. An EKG obtained in the office shows sinus tachycardia with normal intervals, normal axis, no evidence of arrhythmia. No evidence of ischemia. Past Medical History  Diagnosis Date  . Allergic rhinitis   . Migraines   . Hyperlipidemia    Current Outpatient Prescriptions on File Prior to Visit  Medication Sig Dispense Refill  . pravastatin (PRAVACHOL) 40 MG tablet TAKE 1 TABLET AT BEDTIME  90 tablet  4   No current facility-administered medications on file prior to visit.   No Known Allergies History   Social History  . Marital Status: Married    Spouse Name: N/A    Number of Children: N/A  . Years of Education: N/A   Occupational History  . Not on file.   Social History Main Topics  . Smoking status: Never Smoker   . Smokeless tobacco: Never Used  . Alcohol Use: No  . Drug Use: No  . Sexual Activity: Yes     Comment: married to [place]   Other Topics Concern  . Not on file   Social History Narrative  . No narrative on file   Family History  Problem Relation Age of Onset  . Osteoporosis Mother   . Hyperlipidemia Mother   . Hypertension Father       Review of Systems  All other  systems reviewed and are negative.       Objective:   Physical Exam  Vitals reviewed. Constitutional:  She is oriented to person, place, and time. She appears well-developed and well-nourished. No distress.  HENT:  Head: Normocephalic and atraumatic.  Right Ear: External ear normal.  Left Ear: External ear normal.  Nose: Mucosal edema and rhinorrhea present.  Mouth/Throat: Oropharynx is clear and moist. No oropharyngeal exudate.  Eyes: Conjunctivae and EOM are normal. Pupils are equal, round, and reactive to light. Right eye exhibits no discharge. Left eye exhibits discharge. No scleral icterus.  Neck: Normal range of motion. Neck supple. No JVD present. No thyromegaly present.  Cardiovascular: Regular rhythm and normal heart sounds.  Tachycardia present.  Exam reveals no gallop and no friction rub.   No murmur heard. Pulmonary/Chest: Effort normal and breath sounds normal. No respiratory distress. She has no wheezes. She has no rales. She exhibits no tenderness.  Abdominal: Soft. Bowel sounds are normal. She exhibits no distension and no mass. There is no tenderness. There is no rebound and no guarding.  Musculoskeletal: Normal range of motion. She exhibits no edema and no tenderness.  Lymphadenopathy:    She has no cervical adenopathy.  Neurological: She is alert and oriented to person, place, and time. She has normal reflexes. She displays normal reflexes. No cranial nerve deficit. She exhibits normal muscle tone. Coordination normal.  Skin: Skin is warm. No rash noted. She is not diaphoretic. No erythema. No pallor.  Psychiatric: She has a normal mood and affect. Her behavior is normal. Judgment and thought content normal.          Assessment & Plan:  1. Routine general medical examination at a health care facility Patient's physical exam, is completely normal.  Defer her Pap smear and breast exam and pelvic exam to her gynecologist. Her cholesterol is excellent. Her other preventive care is up to date.  2. Tachycardia Patient did have sinus tachycardia related to strain from  coughing. - EKG 12-Lead  3. Chronic cough Believes she has upper airway cough syndrome. I recommended Flonase 2 sprays each nostril daily, Nexium 40 mg by mouth daily, and Hycodan 1 teaspoon every 8 hours as needed for coughing to break the cycle of coughing.  Also check a chest x-ray as well as blood work to whooping cough. - Bordetella pertussis antibody - DG Chest 2 View; Future - esomeprazole (NEXIUM) 40 MG capsule; Take 1 capsule (40 mg total) by mouth daily at 12 noon.  Dispense: 30 capsule; Refill: 3 - fluticasone (FLONASE) 50 MCG/ACT nasal spray; Place 2 sprays into both nostrils daily.  Dispense: 16 g; Refill: 6 - HYDROcodone-homatropine (HYCODAN) 5-1.5 MG/5ML syrup; Take 5 mLs by mouth every 8 (eight) hours as needed for cough.  Dispense: 120 mL; Refill: 0

## 2014-03-08 ENCOUNTER — Ambulatory Visit
Admission: RE | Admit: 2014-03-08 | Discharge: 2014-03-08 | Disposition: A | Discharge status: Home / Self Care | Payer: BC Managed Care – PPO | Source: Ambulatory Visit | Attending: Family Medicine | Admitting: Family Medicine

## 2014-03-08 DIAGNOSIS — R05 Cough: Secondary | ICD-10-CM

## 2014-03-08 DIAGNOSIS — R053 Chronic cough: Secondary | ICD-10-CM

## 2014-03-10 LAB — BORDETELLA PERTUSSIS ANTIBODY
B PERTUSSIS IGM AB, QUANT: 0.7 U/mL (ref <=1.1)
B pertussis IgA Ab, Quant: 0.8 U/mL (ref <=1.1)
B pertussis IgG Ab, Quant: 3.5 U/mL — ABNORMAL HIGH (ref 0.0–0.9)

## 2014-03-11 LAB — BORDETELLA PERTUSSIS, IGG BY IB (RFLX)
B PERTUSSIS IGG IBA PT100: NEGATIVE
B PERTUSSIS IGG IBA PT: POSITIVE
B pertussis, IgG: POSITIVE

## 2014-03-15 ENCOUNTER — Telehealth: Payer: Self-pay | Admitting: Family Medicine

## 2014-03-15 NOTE — Telephone Encounter (Signed)
Husband called to state wife received Nexium and Flonase from mail order.  They didn't realize it was ordered when she was here for last office visit and had gone out and bought OTC versions of both.  Then he was concerned over price the "insurance" paid for them ???  Told him he could call [facility] and see if he could return if he was concerned.  Also told him the OTC were not the same prescription dose as what was sent but if wife has relief with OTC's and is fine with paying for them, then provider is fine with that.

## 2014-03-28 ENCOUNTER — Encounter: Payer: Self-pay | Admitting: Family Medicine

## 2014-03-28 ENCOUNTER — Ambulatory Visit (INDEPENDENT_AMBULATORY_CARE_PROVIDER_SITE_OTHER): Payer: BC Managed Care – PPO | Admitting: Family Medicine

## 2014-03-28 VITALS — BP 120/82 | HR 76 | Temp 98.3°F | Resp 18 | Ht 63.5 in | Wt 145.0 lb

## 2014-03-28 DIAGNOSIS — R053 Chronic cough: Secondary | ICD-10-CM

## 2014-03-28 DIAGNOSIS — R059 Cough, unspecified: Secondary | ICD-10-CM

## 2014-03-28 DIAGNOSIS — R05 Cough: Secondary | ICD-10-CM

## 2014-03-28 MED ORDER — BECLOMETHASONE DIPROPIONATE 80 MCG/ACT IN AERS: 2.0000 | INHALATION_SPRAY | Freq: Two times a day (BID) | RESPIRATORY_TRACT | Status: DC

## 2014-03-28 NOTE — Progress Notes (Signed)
Subjective:    Patient ID: Kim Fitzpatrick, female    DOB: [birth year], 58 y.o.   MRN: [id number]  HPI  03/07/14  The patient has had a persistent dry nonproductive cough for several months. It began in November. She was treated with antibiotics. A chest x-ray was normal. She does continue to experience the cough now unrelenting for 4 months. It tends to be worse at night when she lies down. She does report some postnasal drip and sinus congestion. She denies any acid reflux. She denies any exposure to whooping cough or tuberculosis. She denies any fevers or chills or hemoptysis. She denies any shortness of breath. The coughing comes in spells and spasms. When it happened she can barely catch her breath. Today on examination after coughing she has significant tachycardia to 118 beats per minute. An EKG obtained in the office shows sinus tachycardia with normal intervals, normal axis, no evidence of arrhythmia. No evidence of ischemia.  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 I recommended Flonase 2 sprays each nostril daily, Nexium 40 mg by mouth daily, and Hycodan 1 teaspoon every 8 hours as needed for coughing to break the cycle of coughing.  Also check a chest x-ray as well as blood work to whooping cough. - Bordetella pertussis antibody - DG Chest 2 View; Future - esomeprazole (NEXIUM) 40 MG capsule; Take 1 capsule (40 mg total) by mouth daily at 12 noon.  Dispense: 30 capsule; Refill: 3 - fluticasone (FLONASE) 50 MCG/ACT nasal spray; Place 2 sprays into both nostrils daily.  Dispense: 16 g; Refill: 6 - HYDROcodone-homatropine (HYCODAN) 5-1.5 MG/5ML syrup; Take 5 mLs by mouth every 8 (eight) hours as needed for cough.  Dispense: 120 mL; Refill: 0  03/28/14 Patient is here today for follow up.  The cough is no better. The above workup was normal. She continues to complain of irritation in her upper and in the back of her throat. She denies any  postnasal drip or sinus congestion. The cough has improved that night but continues to happen during the day whenever she talks.  She denies any hemoptysis or weight loss or chest pain or shortness of breath. She had a similar incident several years ago. She saw a pulmonologist in [place]. At that time she will underwent laryngoscopy, 24-hour pH monitoring probe, pulmonary function test and all of the above were  normal and the cough gradually subsided on its own. Past Medical History  Diagnosis Date  . Allergic rhinitis   . Migraines   . Hyperlipidemia    Current Outpatient Prescriptions on File Prior to Visit  Medication Sig Dispense Refill  . esomeprazole (NEXIUM) 40 MG capsule Take 1 capsule (40 mg total) by mouth daily at 12 noon.  30 capsule  3  . fluticasone (FLONASE) 50 MCG/ACT nasal spray Place 2 sprays into both nostrils daily.  16 g  6  . guaiFENesin-codeine 100-10 MG/5ML syrup Take 5 mLs by mouth every 8 (eight) hours.  120 mL  0  . HYDROcodone-homatropine (HYCODAN) 5-1.5 MG/5ML syrup Take 5 mLs by mouth every 8 (eight) hours as needed for cough.  120 mL  0  . pravastatin (PRAVACHOL) 40 MG tablet TAKE 1 TABLET AT BEDTIME  90 tablet  4   No current facility-administered medications on file prior to visit.   No Known Allergies History   Social History  . Marital Status: Married    Spouse Name: N/A  Number of Children: N/A  . Years of Education: N/A   Occupational History  . Not on file.   Social History Main Topics  . Smoking status: Never Smoker   . Smokeless tobacco: Never Used  . Alcohol Use: No  . Drug Use: No  . Sexual Activity: Yes     Comment: married to [place]   Other Topics Concern  . Not on file   Social History Narrative  . No narrative on file   Family History  Problem Relation Age of Onset  . Osteoporosis Mother   . Hyperlipidemia Mother   . Hypertension Father       Review of Systems  All other systems reviewed and are negative.        Objective:   Physical Exam  Vitals reviewed. Constitutional: She appears well-developed and well-nourished. No distress.  HENT:  Head: Normocephalic and atraumatic.  Right Ear: External ear normal.  Left Ear: External ear normal.  Nose: Mucosal edema and rhinorrhea present.  Mouth/Throat: Oropharynx is clear and moist. No oropharyngeal exudate.  Eyes: Conjunctivae and EOM are normal. Pupils are equal, round, and reactive to light. Right eye exhibits no discharge. Left eye exhibits no discharge. No scleral icterus.  Neck: Normal range of motion. Neck supple. No JVD present. No thyromegaly present.  Cardiovascular: Regular rhythm and normal heart sounds.  Exam reveals no gallop and no friction rub.   No murmur heard. Pulmonary/Chest: Effort normal and breath sounds normal. No respiratory distress. She has no wheezes. She has no rales. She exhibits no tenderness.  Lymphadenopathy:    She has no cervical adenopathy.  Skin: She is not diaphoretic.          Assessment & Plan:  1. Chronic cough I still believe this represents upper airway cough syndrome from a combination of postnasal drip, laryngo-esophageal reflux, and chronic irritation of the upper airway.  Therefore I recommend pulmonology consultation patient declines at this time. She states she feels like it is gradually improving. I recommend she continue the Nexium. Also recommended that she add QVAR 80 mcg 2 puffs inhaled twice a day to try to calm the upper airway irritation.  Recheck in one month. If no better I would definitely recommend a pulmonology consultation.

## 2014-04-11 ENCOUNTER — Ambulatory Visit (INDEPENDENT_AMBULATORY_CARE_PROVIDER_SITE_OTHER): Payer: BC Managed Care – PPO | Admitting: Family Medicine

## 2014-04-11 ENCOUNTER — Encounter: Payer: Self-pay | Admitting: Family Medicine

## 2014-04-11 VITALS — BP 130/80 | HR 110 | Temp 98.3°F | Resp 16 | Ht 63.0 in | Wt 145.0 lb

## 2014-04-11 DIAGNOSIS — R059 Cough, unspecified: Secondary | ICD-10-CM

## 2014-04-11 DIAGNOSIS — R05 Cough: Secondary | ICD-10-CM

## 2014-04-11 DIAGNOSIS — J209 Acute bronchitis, unspecified: Secondary | ICD-10-CM

## 2014-04-11 DIAGNOSIS — R053 Chronic cough: Secondary | ICD-10-CM

## 2014-04-11 MED ORDER — LEVOFLOXACIN 500 MG PO TABS: 500.0000 mg | ORAL_TABLET | Freq: Every day | ORAL | Status: DC

## 2014-04-11 MED ORDER — PREDNISONE 20 MG PO TABS: ORAL_TABLET | ORAL | Status: DC

## 2014-04-11 NOTE — Progress Notes (Signed)
Subjective:    Patient ID: Kim Fitzpatrick, female    DOB: 11/10/1956, 58 y.o.   MRN: [id number]  HPI  03/07/14  The patient has had a persistent dry nonproductive cough for several months. It began in November. She was treated with antibiotics. A chest x-ray was normal. She does continue to experience the cough now unrelenting for 4 months. It tends to be worse at night when she lies down. She does report some postnasal drip and sinus congestion. She denies any acid reflux. She denies any exposure to whooping cough or tuberculosis. She denies any fevers or chills or hemoptysis. She denies any shortness of breath. The coughing comes in spells and spasms. When it happened she can barely catch her breath. Today on examination after coughing she has significant tachycardia to 118 beats per minute. An EKG obtained in the office shows sinus tachycardia with normal intervals, normal axis, no evidence of arrhythmia. No evidence of ischemia.  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 I recommended Flonase 2 sprays each nostril daily, Nexium 40 mg by mouth daily, and Hycodan 1 teaspoon every 8 hours as needed for coughing to break the cycle of coughing.  Also check a chest x-ray as well as blood work to whooping cough. - Bordetella pertussis antibody - DG Chest 2 View; Future - esomeprazole (NEXIUM) 40 MG capsule; Take 1 capsule (40 mg total) by mouth daily at 12 noon.  Dispense: 30 capsule; Refill: 3 - fluticasone (FLONASE) 50 MCG/ACT nasal spray; Place 2 sprays into both nostrils daily.  Dispense: 16 g; Refill: 6 - HYDROcodone-homatropine (HYCODAN) 5-1.5 MG/5ML syrup; Take 5 mLs by mouth every 8 (eight) hours as needed for cough.  Dispense: 120 mL; Refill: 0  03/28/14 Patient is here today for follow up.  The cough is no better. The above workup was normal. She continues to complain of irritation in her upper and in the back of her throat. She denies any  postnasal drip or sinus congestion. The cough has improved that night but continues to happen during the day whenever she talks.  She denies any hemoptysis or weight loss or chest pain or shortness of breath. She had a similar incident several years ago. She saw a pulmonologist in [place]. At that time she will underwent laryngoscopy, 24-hour pH monitoring probe, pulmonary function test and all of the above were  normal and the cough gradually subsided on its own.  At that time, my plan was: 1. Chronic cough I still believe this represents upper airway cough syndrome from a combination of postnasal drip, laryngo-esophageal reflux, and chronic irritation of the upper airway.  Therefore I recommend pulmonology consultation patient declines at this time. She states she feels like it is gradually improving. I recommend she continue the Nexium. Also recommended that she add QVAR 80 mcg 2 puffs inhaled twice a day to try to calm the upper airway irritation.  Recheck in one month. If no better I would definitely recommend a pulmonology consultation.  04/11/14  The patient's cough is no better. Over the weekend she developed chest congestion and rhinorrhea and the cough has acutely worsened. The cough is dry and nonproductive. She reports mild shortness of breath and central pleurisy. She denies hemoptysis. The patient is forcing herself to cough while I am in the exam room.  She is unable to stop coughing during the entire encounter.   Past Medical History  Diagnosis Date  . Allergic  rhinitis   . Migraines   . Hyperlipidemia    Current Outpatient Prescriptions on File Prior to Visit  Medication Sig Dispense Refill  . beclomethasone (QVAR) 80 MCG/ACT inhaler Inhale 2 puffs into the lungs 2 (two) times daily.  1 Inhaler  12  . esomeprazole (NEXIUM) 40 MG capsule Take 1 capsule (40 mg total) by mouth daily at 12 noon.  30 capsule  3  . fluticasone (FLONASE) 50 MCG/ACT nasal spray Place 2 sprays into both  nostrils daily.  16 g  6  . pravastatin (PRAVACHOL) 40 MG tablet TAKE 1 TABLET AT BEDTIME  90 tablet  4   No current facility-administered medications on file prior to visit.   No Known Allergies History   Social History  . Marital Status: Married    Spouse Name: N/A    Number of Children: N/A  . Years of Education: N/A   Occupational History  . Not on file.   Social History Main Topics  . Smoking status: Never Smoker   . Smokeless tobacco: Never Used  . Alcohol Use: No  . Drug Use: No  . Sexual Activity: Yes     Comment: married to [place]   Other Topics Concern  . Not on file   Social History Narrative  . No narrative on file   Family History  Problem Relation Age of Onset  . Osteoporosis Mother   . Hyperlipidemia Mother   . Hypertension Father       Review of Systems  All other systems reviewed and are negative.      Objective:   Physical Exam  Vitals reviewed. Constitutional: She appears well-developed and well-nourished. No distress.  HENT:  Head: Normocephalic and atraumatic.  Right Ear: External ear normal.  Left Ear: External ear normal.  Nose: Mucosal edema and rhinorrhea present.  Mouth/Throat: Oropharynx is clear and moist. No oropharyngeal exudate.  Eyes: Conjunctivae and EOM are normal. Pupils are equal, round, and reactive to light. Right eye exhibits no discharge. Left eye exhibits no discharge. No scleral icterus.  Neck: Normal range of motion. Neck supple. No JVD present. No thyromegaly present.  Cardiovascular: Regular rhythm and normal heart sounds.  Exam reveals no gallop and no friction rub.   No murmur heard. Pulmonary/Chest: Effort normal and breath sounds normal. No respiratory distress. She has no wheezes. She has no rales. She exhibits no tenderness.  Lymphadenopathy:    She has no cervical adenopathy.  Skin: She is not diaphoretic.          Assessment & Plan:  Acute bronchitis - Plan: predniSONE (DELTASONE) 20 MG tablet,  levofloxacin (LEVAQUIN) 500 MG tablet  Chronic cough - Plan: Ambulatory referral to Pulmonology  I still the patient has upper airway cough syndrome but I do believe she's developed acute bronchitis in addition. Also the patient on Levaquin 500 mg by mouth daily for 7 days and also prednisone taper pack in case there is an element of reactive airway disease/allergies causing her cough. I am also consult a pulmonologist because the cough has been persistent now for several months and has failed all conservative therapy.

## 2014-04-13 ENCOUNTER — Telehealth: Payer: Self-pay | Admitting: Family Medicine

## 2014-04-13 NOTE — Telephone Encounter (Signed)
Call back number is [phone] Pt is needing a refill on Hydrocodeone-chlorphener Kim Fitzpatrick

## 2014-04-13 NOTE — Telephone Encounter (Signed)
?   Do not see on med list..ok to refill?

## 2014-04-14 MED ORDER — BENZONATATE 200 MG PO CAPS: 200.0000 mg | ORAL_CAPSULE | Freq: Four times a day (QID) | ORAL | Status: DC

## 2014-04-14 NOTE — Telephone Encounter (Signed)
Med sent to pharm and pt aware per vm

## 2014-04-14 NOTE — Telephone Encounter (Signed)
Tessalon perles 200 q 8 hrs and schedule pulm consult.

## 2014-04-20 ENCOUNTER — Institutional Professional Consult (permissible substitution): Payer: BC Managed Care – PPO | Admitting: Internal Medicine

## 2014-04-22 ENCOUNTER — Encounter: Payer: Self-pay | Admitting: Internal Medicine

## 2014-04-22 ENCOUNTER — Ambulatory Visit (INDEPENDENT_AMBULATORY_CARE_PROVIDER_SITE_OTHER): Payer: BC Managed Care – PPO | Admitting: Internal Medicine

## 2014-04-22 VITALS — BP 124/80 | HR 90 | Temp 98.4°F | Ht 63.0 in | Wt 145.0 lb

## 2014-04-22 DIAGNOSIS — R05 Cough: Secondary | ICD-10-CM

## 2014-04-22 DIAGNOSIS — R058 Other specified cough: Secondary | ICD-10-CM

## 2014-04-22 DIAGNOSIS — R059 Cough, unspecified: Secondary | ICD-10-CM

## 2014-04-22 MED ORDER — PREDNISONE 10 MG PO TABS: ORAL_TABLET | ORAL | Status: DC

## 2014-04-22 MED ORDER — TRAMADOL HCL 50 MG PO TABS: ORAL_TABLET | ORAL | Status: DC

## 2014-04-22 MED ORDER — FAMOTIDINE 20 MG PO TABS: ORAL_TABLET | ORAL | Status: DC

## 2014-04-22 NOTE — Patient Instructions (Signed)
Prednisone 10 mg take  4 each am x 2 days,   2 each am x 2 days,  1 each am x 2 days and stop   The key to effective treatment for your cough is eliminating the non-stop cycle of cough you're stuck in long enough to let your airway heal completely and then see if there is anything still making you cough once you stop the cough suppression, but this should take no more than 5 days to figure out  First take delsym two tsp every 12 hours and supplement if needed with  tramadol 50 mg up to 2 every 4 hours to suppress the urge to cough at all or even clear your throat. Swallowing water or using ice chips/non mint and menthol containing candies (such as lifesavers or sugarless jolly ranchers) are also effective.  You should rest your voice and avoid activities that you know make you cough.  Once you have eliminated the cough for 3 straight days try reducing the tramadol first,  then the delsym as tolerated.    Nexium 40   Take 30-60 min before first meal of the day and Pepcid 20 mg one bedtime with chloretrimeton 4 mg 2 at bedtime   GERD (REFLUX)  is an extremely common cause of respiratory symptoms, many times with no significant heartburn at all.    It can be treated with medication, but also with lifestyle changes including avoidance of late meals, excessive alcohol, smoking cessation, and avoid fatty foods, chocolate, peppermint, colas, red wine, and acidic juices such as orange juice.  NO MINT OR MENTHOL PRODUCTS SO NO COUGH DROPS  USE SUGARLESS CANDY INSTEAD (jolley ranchers or Stover's and Life Savers)  NO OIL BASED VITAMINS - use powdered substitutes.  Please schedule a follow up office visit in 2 weeks, sooner if needed

## 2014-04-22 NOTE — Progress Notes (Signed)
Subjective:    Patient ID: Kim Fitzpatrick, female    DOB: 1956/01/24  MRN: [id number]  HPI  58 yo [place] never smoked moved to [facility] from [place] then tendency to cough developed in winter w/in a few years  of arriving in [place]> recurrent pattern of  a winter time cough typically x 3 m some better when returns to [place]  Then onset November 2014 (p sev years of no coughing in same house she still lives in)  p apparent uri persistent since then despite rx for asthma and GERD so referred 04/22/2014 to pulmonary clinic    04/22/2014 1st [facility] office visit/ Rocklin Soderquist  Chief Complaint  Patient presents with  . Pulmonary Consult    Referred per Dr. Dennard Schaumann. Pt c/o cough for 38 yrs- comes and goes and usually last approx 3 months at a time. Cough is non prod and esp worse at night and when she gets exited.   only better with tussionex    Kouffman Reflux v Neurogenic Cough Differentiator Reflux Comments  Do you awaken from a sound sleep coughing violently?                            With trouble breathing? Yes   Do you have choking episodes when you cannot  Get enough air, gasping for air ?              Yes   Do you usually cough when you lie down into  The bed, or when you just lie down to rest ?                          Yes   Do you usually cough after meals or eating?         no   Do you cough when (or after) you bend over?    Yes   GERD SCORE     Kouffman Reflux v Neurogenic Cough Differentiator Neurogenic   Do you more-or-less cough all day long? yes   Does change of temperature make you cough? Cold worse   Does laughing or chuckling cause you to cough? yes   Do fumes (perfume, automobile fumes, burned  Toast, etc.,) cause you to cough ?      no   Does speaking, singing, or talking on the phone cause you to cough   ?               yes   Neurogenic/Airway score       No obvious other patterns in day to day or daytime variabilty or assoc sob or cp or chest tightness, subjective wheeze  overt sinus or hb symptoms. No unusual exp hx or h/o childhood pna/ asthma or knowledge of premature birth.   Also denies any obvious fluctuation of symptoms with weather or environmental changes or other aggravating or alleviating factors except as outlined above   Current Medications, Allergies, Complete Past Medical History, Past Surgical History, Family History, and Social History were reviewed in [employer] record.           Review of Systems  Constitutional: Negative for fever, chills and unexpected weight change.  HENT: Positive for congestion and ear pain. Negative for dental problem, nosebleeds, postnasal drip, rhinorrhea, sinus pressure, sneezing, sore throat, trouble swallowing and voice change.   Eyes: Negative for visual disturbance.  Respiratory: Positive for cough. Negative  for choking and shortness of breath.   Cardiovascular: Negative for chest pain and leg swelling.  Gastrointestinal: Negative for vomiting, abdominal pain and diarrhea.  Genitourinary: Negative for difficulty urinating.  Musculoskeletal: Negative for arthralgias.  Skin: Negative for rash.  Neurological: Positive for headaches. Negative for tremors and syncope.  Hematological: Does not bruise/bleed easily.       Objective:   Physical Exam  amb hoarse hispanic female can't speak without harsh barking cough/ very anxious  Wt Readings from Last 3 Encounters:  04/22/14 145 lb (65.772 kg)  04/11/14 145 lb (65.772 kg)  03/28/14 145 lb (65.772 kg)     HEENT: nl dentition, turbinates, and orophanx. Nl external ear canals without cough reflex   NECK :  without JVD/Nodes/TM/ nl carotid upstrokes bilaterally   LUNGS: no acc muscle use, clear to A and P bilaterally   CV:  RRR  no s3 or murmur or increase in P2, no edema   ABD:  soft and nontender with nl excursion in the supine position. No bruits or organomegaly, bowel sounds nl  MS:  warm without deformities, calf  tenderness, cyanosis or clubbing  SKIN: warm and dry without lesions    NEURO:  alert, approp, no deficits     CXR  04/22/2014 : The heart size and mediastinal contours are within normal limits.  Both lungs are clear. The visualized skeletal structures are  unremarkable.         Assessment & Plan:

## 2014-04-23 DIAGNOSIS — R05 Cough: Secondary | ICD-10-CM | POA: Insufficient documentation

## 2014-04-23 DIAGNOSIS — R058 Other specified cough: Secondary | ICD-10-CM | POA: Insufficient documentation

## 2014-04-23 NOTE — Assessment & Plan Note (Signed)

## 2014-05-06 ENCOUNTER — Ambulatory Visit (INDEPENDENT_AMBULATORY_CARE_PROVIDER_SITE_OTHER): Payer: BC Managed Care – PPO | Admitting: Internal Medicine

## 2014-05-06 ENCOUNTER — Encounter: Payer: Self-pay | Admitting: Internal Medicine

## 2014-05-06 VITALS — BP 122/74 | HR 76 | Temp 98.0°F | Ht 63.0 in | Wt 144.8 lb

## 2014-05-06 DIAGNOSIS — R058 Other specified cough: Secondary | ICD-10-CM

## 2014-05-06 DIAGNOSIS — R05 Cough: Secondary | ICD-10-CM

## 2014-05-06 DIAGNOSIS — R059 Cough, unspecified: Secondary | ICD-10-CM

## 2014-05-06 MED ORDER — MONTELUKAST SODIUM 10 MG PO TABS: ORAL_TABLET | ORAL | Status: DC

## 2014-05-06 NOTE — Progress Notes (Signed)
Subjective:    Patient ID: Kim Fitzpatrick, female    DOB: [birth year]  MRN: [id number]   Brief patient profile:  58 yo [place] never smoked moved to [place] from [place] then tendency to cough developed in winter w/in a few years  of arriving in [place]> recurrent pattern of  a winter time cough typically x 3 m some better when returns to [place]  Then onset November 2014 (p sev years of no coughing in same house she still lives in)  p apparent uri persistent since then despite rx for asthma and GERD so referred 04/22/2014 to pulmonary clinic   History of Present Illness  04/22/2014 1st [facility] office visit/ Neftaly Inzunza  Chief Complaint  Patient presents with  . Pulmonary Consult    Referred per Dr. Dennard Schaumann. Pt c/o cough for 38 yrs- comes and goes and usually last approx 3 months at a time. Cough is non prod and esp worse at night and when she gets exited.   only better with tussionex    Kouffman Reflux v Neurogenic Cough Differentiator Reflux Comments  Do you awaken from a sound sleep coughing violently?                            With trouble breathing? Yes   Do you have choking episodes when you cannot  Get enough air, gasping for air ?              Yes   Do you usually cough when you lie down into  The bed, or when you just lie down to rest ?                          Yes   Do you usually cough after meals or eating?         no   Do you cough when (or after) you bend over?    Yes   GERD SCORE     Kouffman Reflux v Neurogenic Cough Differentiator Neurogenic   Do you more-or-less cough all day long? yes   Does change of temperature make you cough? Cold worse   Does laughing or chuckling cause you to cough? yes   Do fumes (perfume, automobile fumes, burned  Toast, etc.,) cause you to cough ?      no   Does speaking, singing, or talking on the phone cause you to cough   ?               yes   Neurogenic/Airway score     rec Prednisone 10 mg take  4 each am x 2 days,   2 each am x 2 days,  1 each  am x 2 days and stop  First take delsym two tsp every 12 hours and supplement if needed with  tramadol 50 mg up to 2 every 4 hours to suppress the urge to cough at all or even clear your throat.   Once you have eliminated the cough for 3 straight days try reducing the tramadol first,  then the delsym as tolerated.   Nexium 40   Take 30-60 min before first meal of the day and Pepcid 20 mg one bedtime with chloretrimeton 4 mg 2 at bedtime  GERD   05/06/2014 f/u ov/Jhon Mallozzi re: chronic dry cough  Chief Complaint  Patient presents with  . Follow-up    Cough is 75% better.  coughs about one hour p rising then around 530 pm but sleeps ok   No obvious other patterns (see questionaire) in day to day or daytime variabilty or assoc chronic cough or cp or chest tightness, subjective wheeze overt sinus or hb symptoms. No unusual exp hx or h/o childhood pna/ asthma or knowledge of premature birth.  Sleeping ok without nocturnal  or early am exacerbation  of respiratory  c/o's or need for noct saba. Also denies any obvious fluctuation of symptoms with weather or environmental changes or other aggravating or alleviating factors except as outlined above   Current Medications, Allergies, Complete Past Medical History, Past Surgical History, Family History, and Social History were reviewed in [employer] record.  ROS  The following are not active complaints unless bolded sore throat, dysphagia, dental problems, itching, sneezing,  nasal congestion or excess/ purulent secretions, ear ache,   fever, chills, sweats, unintended wt loss, pleuritic or exertional cp, hemoptysis,  orthopnea pnd or leg swelling, presyncope, palpitations, heartburn, abdominal pain, anorexia, nausea, vomiting, diarrhea  or change in bowel or urinary habits, change in stools or urine, dysuria,hematuria,  rash, arthralgias, visual complaints, headache, numbness weakness or ataxia or problems with walking or coordination,   change in mood/affect or memory.                     Objective:   Physical Exam  amb   hispanic female  No longer hoarse / no cough with talking  05/06/2014          145  Wt Readings from Last 3 Encounters:  04/22/14 145 lb (65.772 kg)  04/11/14 145 lb (65.772 kg)  03/28/14 145 lb (65.772 kg)     HEENT: nl dentition, turbinates, and orophanx. Nl external ear canals without cough reflex   NECK :  without JVD/Nodes/TM/ nl carotid upstrokes bilaterally   LUNGS: no acc muscle use, clear to A and P bilaterally   CV:  RRR  no s3 or murmur or increase in P2, no edema   ABD:  soft and nontender with nl excursion in the supine position. No bruits or organomegaly, bowel sounds nl  MS:  warm without deformities, calf tenderness, cyanosis or clubbing  SKIN: warm and dry without lesions    NEURO:  alert, approp, no deficits     CXR  04/22/2014 : The heart size and mediastinal contours are within normal limits.  Both lungs are clear. The visualized skeletal structures are  unremarkable.         Assessment & Plan:

## 2014-05-06 NOTE — Patient Instructions (Addendum)
Increase the chlortrimeton back to 2 at bedtime  Add singulair (montelukast)  10 mg daily in pm   Delsym up to 2 tsp every  12 hours as needed for cough   Please see patient coordinator before you leave today  to schedule sinus CT and schedule a methacholine challenge test  Please remember to go to the lab department downstairs for your tests - we will call you with the results when they are available.  Please schedule a follow up office visit in 4 weeks, sooner if needed

## 2014-05-08 NOTE — Assessment & Plan Note (Addendum)
Better but not eliminated ? To what extent this was just suppression by tramadol rather than elimination of cyclical cough  The standardized cough guidelines published in Chest by Lissa Morales in 2006 are still the best available and consist of a multiple step process (up to 12!) , not a single office visit,  and are intended  to address this problem logically,  with an alogrithm dependent on response to empiric treatment at  each progressive step  to determine a specific diagnosis with  minimal addtional testing needed. Therefore if adherence is an issue or can't be accurately verified,  it's very unlikely the standard evaluation and treatment will be successful here.    Furthermore, response to therapy (other than acute cough suppression, which should only be used short term with avoidance of narcotic containing cough syrups if possible), can be a gradual process for which the patient may perceive immediate benefit.  Unlike going to an eye doctor where the best perscription is almost always the first one and is immediately effective, this is almost never the case in the management of chronic cough syndromes. Therefore the patient needs to commit up front to consistently adhere to recommendations  for up to 6 weeks of therapy directed at the likely underlying problem(s) before the response can be reasonably evaluated.   For now add singulair, check sinus CT and allergy profile and proceed with MCT  See instructions for specific recommendations which were reviewed directly with the patient who was given a copy with highlighter outlining the key components.

## 2014-05-17 ENCOUNTER — Encounter: Payer: Self-pay | Admitting: Internal Medicine

## 2014-05-17 ENCOUNTER — Ambulatory Visit (INDEPENDENT_AMBULATORY_CARE_PROVIDER_SITE_OTHER)
Admission: RE | Admit: 2014-05-17 | Discharge: 2014-05-17 | Disposition: A | Discharge status: Home / Self Care | Payer: BC Managed Care – PPO | Source: Ambulatory Visit | Attending: Internal Medicine | Admitting: Internal Medicine

## 2014-05-17 ENCOUNTER — Other Ambulatory Visit: Payer: Self-pay | Admitting: Internal Medicine

## 2014-05-17 DIAGNOSIS — R058 Other specified cough: Secondary | ICD-10-CM

## 2014-05-17 DIAGNOSIS — R059 Cough, unspecified: Secondary | ICD-10-CM

## 2014-05-17 DIAGNOSIS — R05 Cough: Secondary | ICD-10-CM

## 2014-05-17 MED ORDER — AMOXICILLIN-POT CLAVULANATE 875-125 MG PO TABS: 1.0000 | ORAL_TABLET | Freq: Two times a day (BID) | ORAL | Status: DC

## 2014-05-17 NOTE — Progress Notes (Signed)
Quick Note:  Spoke with pt and notified of results per Dr. Wert. Pt verbalized understanding and denied any questions.  ______ 

## 2014-05-18 ENCOUNTER — Ambulatory Visit (HOSPITAL_COMMUNITY)
Admission: RE | Admit: 2014-05-18 | Discharge: 2014-05-18 | Disposition: A | Discharge status: Home / Self Care | Payer: BC Managed Care – PPO | Source: Ambulatory Visit | Attending: Internal Medicine | Admitting: Internal Medicine

## 2014-05-18 DIAGNOSIS — R05 Cough: Secondary | ICD-10-CM | POA: Insufficient documentation

## 2014-05-18 DIAGNOSIS — R058 Other specified cough: Secondary | ICD-10-CM

## 2014-05-18 DIAGNOSIS — R059 Cough, unspecified: Secondary | ICD-10-CM | POA: Insufficient documentation

## 2014-05-18 MED ORDER — METHACHOLINE 0.25 MG/ML NEB SOLN
2.0000 mL | Freq: Once | RESPIRATORY_TRACT | Status: AC
  Administered 2014-05-18: 0.5 mg via RESPIRATORY_TRACT

## 2014-05-18 MED ORDER — SODIUM CHLORIDE 0.9 % IN NEBU
3.0000 mL | INHALATION_SOLUTION | Freq: Once | RESPIRATORY_TRACT | Status: AC
  Administered 2014-05-18: 3 mL via RESPIRATORY_TRACT

## 2014-05-18 MED ORDER — METHACHOLINE 4 MG/ML NEB SOLN
2.0000 mL | Freq: Once | RESPIRATORY_TRACT | Status: AC
  Administered 2014-05-18: 8 mg via RESPIRATORY_TRACT

## 2014-05-18 MED ORDER — ALBUTEROL SULFATE (2.5 MG/3ML) 0.083% IN NEBU
2.5000 mg | INHALATION_SOLUTION | Freq: Once | RESPIRATORY_TRACT | Status: AC
  Administered 2014-05-18: 2.5 mg via RESPIRATORY_TRACT

## 2014-05-18 MED ORDER — METHACHOLINE 16 MG/ML NEB SOLN
2.0000 mL | Freq: Once | RESPIRATORY_TRACT | Status: AC
  Administered 2014-05-18: 32 mg via RESPIRATORY_TRACT

## 2014-05-18 MED ORDER — METHACHOLINE 1 MG/ML NEB SOLN
2.0000 mL | Freq: Once | RESPIRATORY_TRACT | Status: AC
  Administered 2014-05-18: 2 mg via RESPIRATORY_TRACT

## 2014-05-18 MED ORDER — METHACHOLINE 0.0625 MG/ML NEB SOLN
2.0000 mL | Freq: Once | RESPIRATORY_TRACT | Status: AC
  Administered 2014-05-18: 0.125 mg via RESPIRATORY_TRACT

## 2014-05-19 LAB — PULMONARY FUNCTION TEST
FEF 25-75 POST: 2.83 L/s
FEF 25-75 PRE: 2.66 L/s
FEF2575-%Change-Post: 6 %
FEF2575-%Pred-Post: 119 %
FEF2575-%Pred-Pre: 112 %
FEV1-%Change-Post: 2 %
FEV1-%Pred-Post: 100 %
FEV1-%Pred-Pre: 98 %
FEV1-POST: 2.53 L
FEV1-Pre: 2.46 L
FEV1FVC-%Change-Post: -1 %
FEV1FVC-%Pred-Pre: 104 %
FEV6-%CHANGE-POST: 4 %
FEV6-%PRED-POST: 99 %
FEV6-%Pred-Pre: 95 %
FEV6-Post: 3.12 L
FEV6-Pre: 2.98 L
FEV6FVC-%CHANGE-POST: 0 %
FEV6FVC-%Pred-Post: 102 %
FEV6FVC-%Pred-Pre: 103 %
FVC-%CHANGE-POST: 3 %
FVC-%PRED-POST: 96 %
FVC-%PRED-PRE: 93 %
FVC-PRE: 3.01 L
FVC-Post: 3.13 L
PRE FEV1/FVC RATIO: 82 %
Post FEV1/FVC ratio: 81 %
Post FEV6/FVC ratio: 100 %
Pre FEV6/FVC Ratio: 100 %

## 2014-06-03 ENCOUNTER — Encounter: Payer: Self-pay | Admitting: Internal Medicine

## 2014-06-03 ENCOUNTER — Ambulatory Visit (INDEPENDENT_AMBULATORY_CARE_PROVIDER_SITE_OTHER): Payer: BC Managed Care – PPO | Admitting: Internal Medicine

## 2014-06-03 VITALS — BP 132/80 | HR 74 | Temp 99.3°F | Ht 63.0 in | Wt 149.0 lb

## 2014-06-03 DIAGNOSIS — R059 Cough, unspecified: Secondary | ICD-10-CM

## 2014-06-03 DIAGNOSIS — R05 Cough: Secondary | ICD-10-CM

## 2014-06-03 DIAGNOSIS — R058 Other specified cough: Secondary | ICD-10-CM

## 2014-06-03 NOTE — Assessment & Plan Note (Addendum)
-   Allergy w/u in [place] neg around 2005  - singulair added 05/06/14  - Sinus CT 05/17/2014  Negative visualized sphenoid, ethmoid and frontal sinuses. Small volume opacification dependently in both maxillary sinuses, with some bubbly component on the right. Both [place] is appear to remain patent. Rightward nasal septal deviation. No acute osseous abnormality identified. 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  Please see instructions for details which were reviewed in writing and the patient given a copy.

## 2014-06-03 NOTE — Progress Notes (Signed)
Subjective:    Patient ID: Kim Fitzpatrick, female    DOB: 1956/11/11  MRN: [id number]   Brief patient profile:  58 yo [place] never smoked moved to [place] from [place] then tendency to cough developed in winter w/in a few years  of arriving in [place]> recurrent pattern of  a winter time cough typically x 3 m some better when returns to [place]  Then onset November 2014 (p sev years of no coughing in same house she still lives in)  p apparent uri persistent since then despite rx for asthma and GERD so referred 04/22/2014 to pulmonary clinic   History of Present Illness  04/22/2014 1st [facility] office visit/ [facility]  Chief Complaint  Patient presents with  . Pulmonary Consult    Referred per Dr. Dennard Schaumann. Pt c/o cough for 38 yrs- comes and goes and usually last approx 3 months at a time. Cough is non prod and esp worse at night and when she gets exited.   only better with tussionex rec Prednisone 10 mg take  4 each am x 2 days,   2 each am x 2 days,  1 each am x 2 days and stop  First take delsym two tsp every 12 hours and supplement if needed with  tramadol 50 mg up to 2 every 4 hours to suppress the urge to cough at all or even clear your throat.   Once you have eliminated the cough for 3 straight days try reducing the tramadol first,  then the delsym as tolerated.   Nexium 40   Take 30-60 min before first meal of the day and Pepcid 20 mg one bedtime with chloretrimeton 4 mg 2 at bedtime  GERD   05/06/2014 f/u ov/Wert re: chronic dry cough  Chief Complaint  Patient presents with  . Follow-up    Cough is 75% better.   coughs about one hour p rising then around 530 pm but sleeps ok  rec Increase the chlortrimeton back to 2 at bedtime Add singulair (montelukast)  10 mg daily in pm  Delsym up to 2 tsp every  12 hours as needed for cough  Sinus CT > ? Sinusitis > finished 05/26/14  MCT neg    06/03/2014 f/u ov/Wert re:  Cough since Nov 2014  Chief Complaint  Patient presents with  . Follow-up   Cough has improved some. No new co's today.       Kouffman Reflux v Neurogenic Cough Differentiator Reflux Comments  Do you awaken from a sound sleep coughing violently?                            With trouble breathing? Not now   Do you have choking episodes when you cannot  Get enough air, gasping for air ?              Not now    Do you usually cough when you lie down into  The bed, or when you just lie down to rest ?                          Not now   Do you usually cough after meals or eating?         no   Do you cough when (or after) you bend over?    Not now   GERD SCORE     Kouffman Reflux v  Neurogenic Cough Differentiator Neurogenic   Do you more-or-less cough all day long? Not now   Does change of temperature make you cough? Not sure   Does laughing or chuckling cause you to cough? some   Do fumes (perfume, automobile fumes, burned  Toast, etc.,) cause you to cough ?      no   Does speaking, singing, or talking on the phone cause you to cough   ?               Sometimes when get excited    Neurogenic/Airway score    Still has sensation of too much throat mucus but note lack of sign mucus production   No obvious other patterns (see questionaire) in day to day or daytime variabilty or assoc sob or cp or chest tightness, subjective wheeze overt sinus or hb symptoms. No unusual exp hx or h/o childhood pna/ asthma or knowledge of premature birth.  Sleeping ok without nocturnal  or early am exacerbation  of respiratory  c/o's or need for noct saba. Also denies any obvious fluctuation of symptoms with weather or environmental changes or other aggravating or alleviating factors except as outlined above   Current Medications, Allergies, Complete Past Medical History, Past Surgical History, Family History, and Social History were reviewed in [employer] record.  ROS  The following are not active complaints unless bolded sore throat, dysphagia, dental problems,  itching, sneezing,  nasal congestion or excess/ purulent secretions, ear ache,   fever, chills, sweats, unintended wt loss, pleuritic or exertional cp, hemoptysis,  orthopnea pnd or leg swelling, presyncope, palpitations, heartburn, abdominal pain, anorexia, nausea, vomiting, diarrhea  or change in bowel or urinary habits, change in stools or urine, dysuria,hematuria,  rash, arthralgias, visual complaints, headache, numbness weakness or ataxia or problems with walking or coordination,  change in mood/affect or memory.                     Objective:   Physical Exam  amb   hispanic female  No longer hoarse / no cough with talking  05/06/2014          145 > 149 06/03/2014  Wt Readings from Last 3 Encounters:  04/22/14 145 lb (65.772 kg)  04/11/14 145 lb (65.772 kg)  03/28/14 145 lb (65.772 kg)     HEENT: nl dentition, turbinates, and orophanx. Nl external ear canals without cough reflex   NECK :  without JVD/Nodes/TM/ nl carotid upstrokes bilaterally   LUNGS: no acc muscle use, clear to A and P bilaterally   CV:  RRR  no s3 or murmur or increase in P2, no edema   ABD:  soft and nontender with nl excursion in the supine position. No bruits or organomegaly, bowel sounds nl  MS:  warm without deformities, calf tenderness, cyanosis or clubbing        CXR  04/22/2014 : The heart size and mediastinal contours are within normal limits.  Both lungs are clear. The visualized skeletal structures are  unremarkable.         Assessment & Plan:

## 2014-06-03 NOTE — Patient Instructions (Addendum)
Increase the chlortrimeton back to 2 at bedtime and use during the day if you sense have excessive drainage (may cause drowsiness)  Continue singulair (montelukast)  10 mg daily in pm   Delsym up to 2 tsp every  12 hours as needed for cough   Continue nexium before breakfast and the pepcid (famotidine) at bedtime  Please see patient coordinator before you leave today  to schedule ENT evaluation - be sure to take your meds with you  Please schedule a follow up office visit in 6 weeks, call sooner if needed with all medications in hand

## 2014-06-16 ENCOUNTER — Other Ambulatory Visit: Payer: Self-pay | Admitting: Family Medicine

## 2014-06-16 NOTE — Telephone Encounter (Signed)
Please see what the medication is being used for, her pulmonary note states that she was suppose to taper off the ultram

## 2014-06-16 NOTE — Telephone Encounter (Signed)
Ok to refill??  Last office visit 04/11/2014.  Medication was deleted on 03/11/2014, but no explanation noted as to why.

## 2014-06-17 NOTE — Telephone Encounter (Signed)
Okay to refill? 

## 2014-06-17 NOTE — Telephone Encounter (Signed)
Call placed to patient.   States that pulmonary was using Tramadol for her pain with cough. States that cough has resolved, but she continues to experience back pain. She reports using Tramadol for her back pain with relief.   MD please advise.

## 2014-07-15 ENCOUNTER — Ambulatory Visit: Payer: BC Managed Care – PPO | Admitting: Internal Medicine

## 2014-07-20 ENCOUNTER — Other Ambulatory Visit: Payer: Self-pay | Admitting: Obstetrics and Gynecology

## 2014-07-20 DIAGNOSIS — M8580 Other specified disorders of bone density and structure, unspecified site: Secondary | ICD-10-CM

## 2014-07-20 DIAGNOSIS — N6320 Unspecified lump in the left breast, unspecified quadrant: Secondary | ICD-10-CM

## 2014-07-26 ENCOUNTER — Ambulatory Visit
Admission: RE | Admit: 2014-07-26 | Discharge: 2014-07-26 | Disposition: A | Discharge status: Home / Self Care | Payer: BC Managed Care – PPO | Source: Ambulatory Visit | Attending: Obstetrics and Gynecology | Admitting: Obstetrics and Gynecology

## 2014-07-26 ENCOUNTER — Encounter (INDEPENDENT_AMBULATORY_CARE_PROVIDER_SITE_OTHER): Payer: Self-pay

## 2014-07-26 ENCOUNTER — Other Ambulatory Visit: Payer: Self-pay | Admitting: Obstetrics and Gynecology

## 2014-07-26 DIAGNOSIS — M8580 Other specified disorders of bone density and structure, unspecified site: Secondary | ICD-10-CM

## 2014-07-26 DIAGNOSIS — N6320 Unspecified lump in the left breast, unspecified quadrant: Secondary | ICD-10-CM

## 2015-01-19 IMAGING — CR DG CHEST 2V
2 series · 2 of 2 positions shown · non-contrast
Comparison: None.

CLINICAL DATA: Chronic cough

EXAM:
CHEST  2 VIEW

[w chest pa]
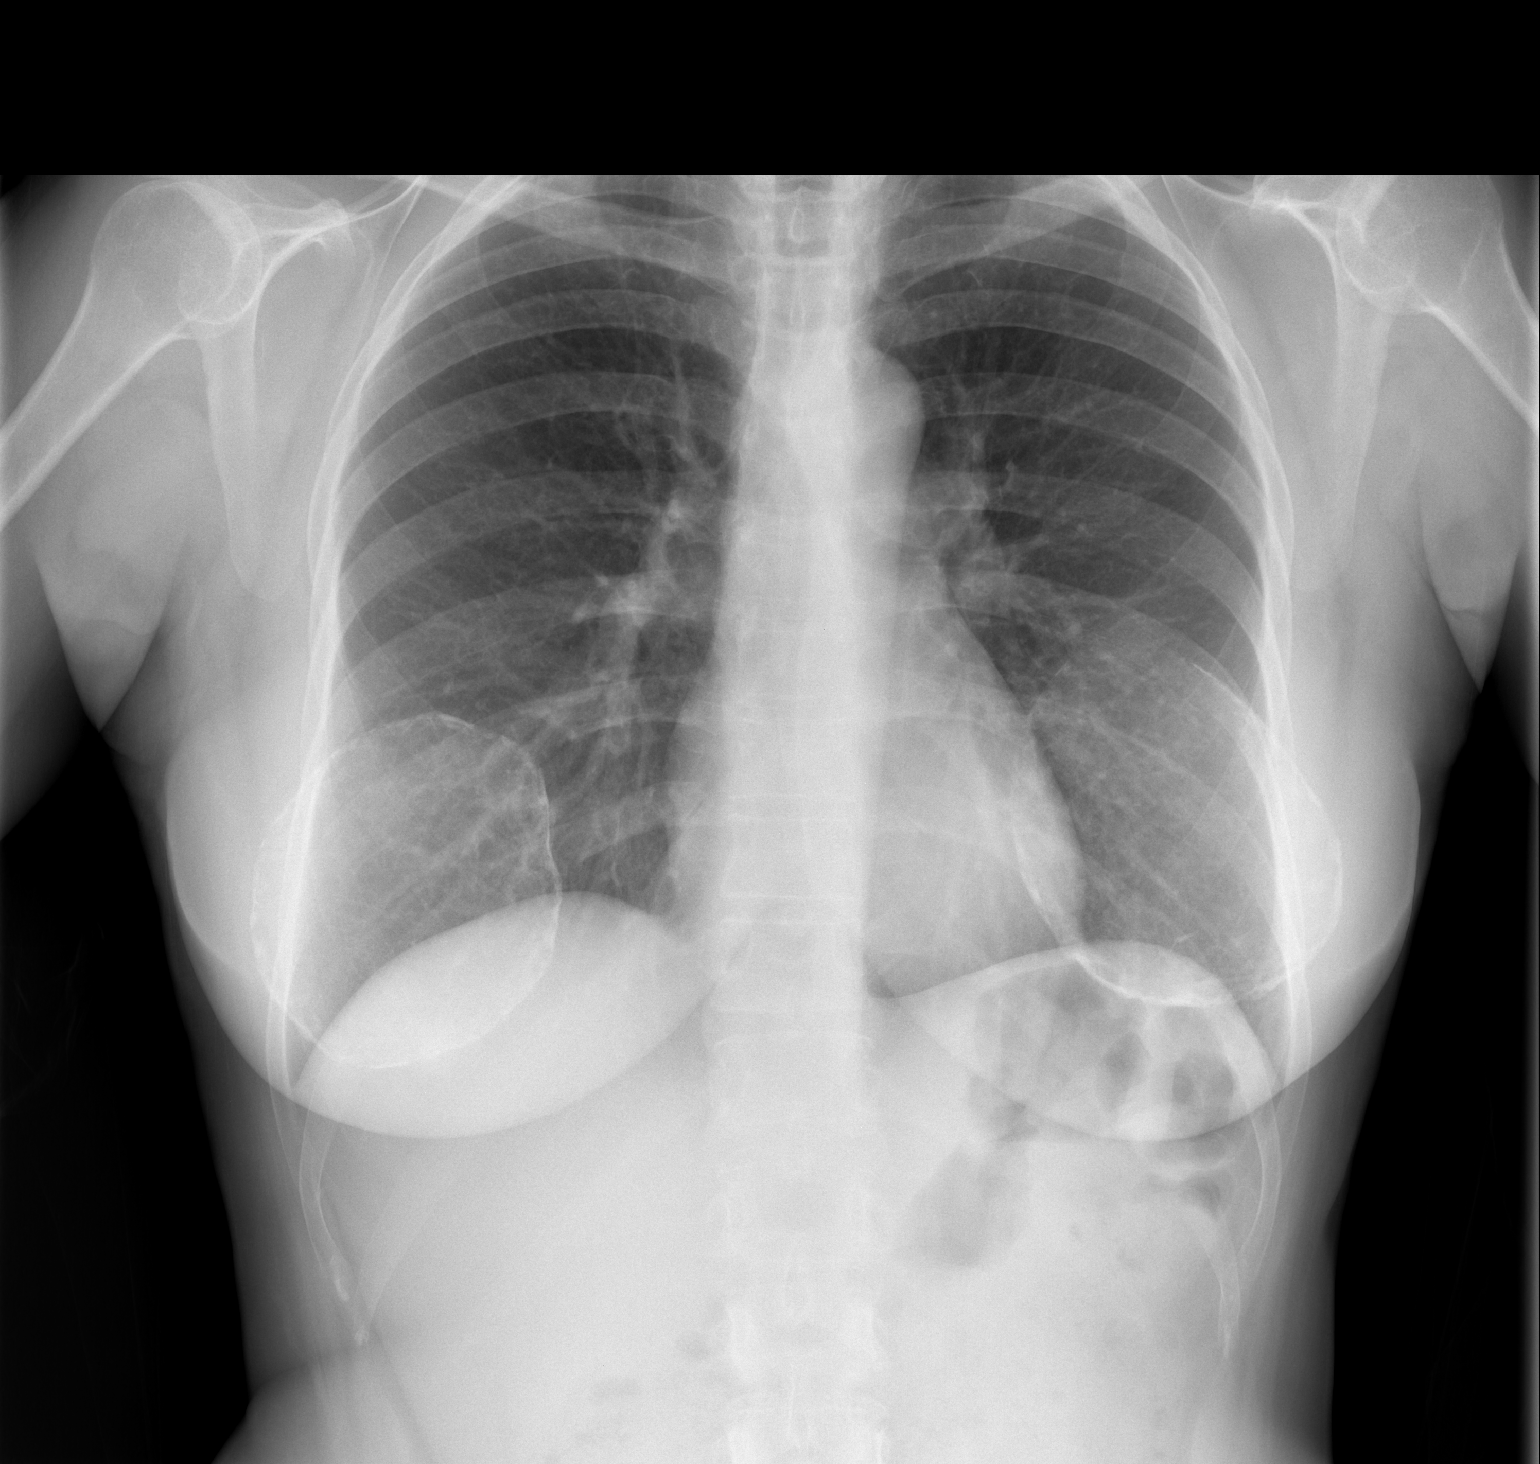

[w chest lat]
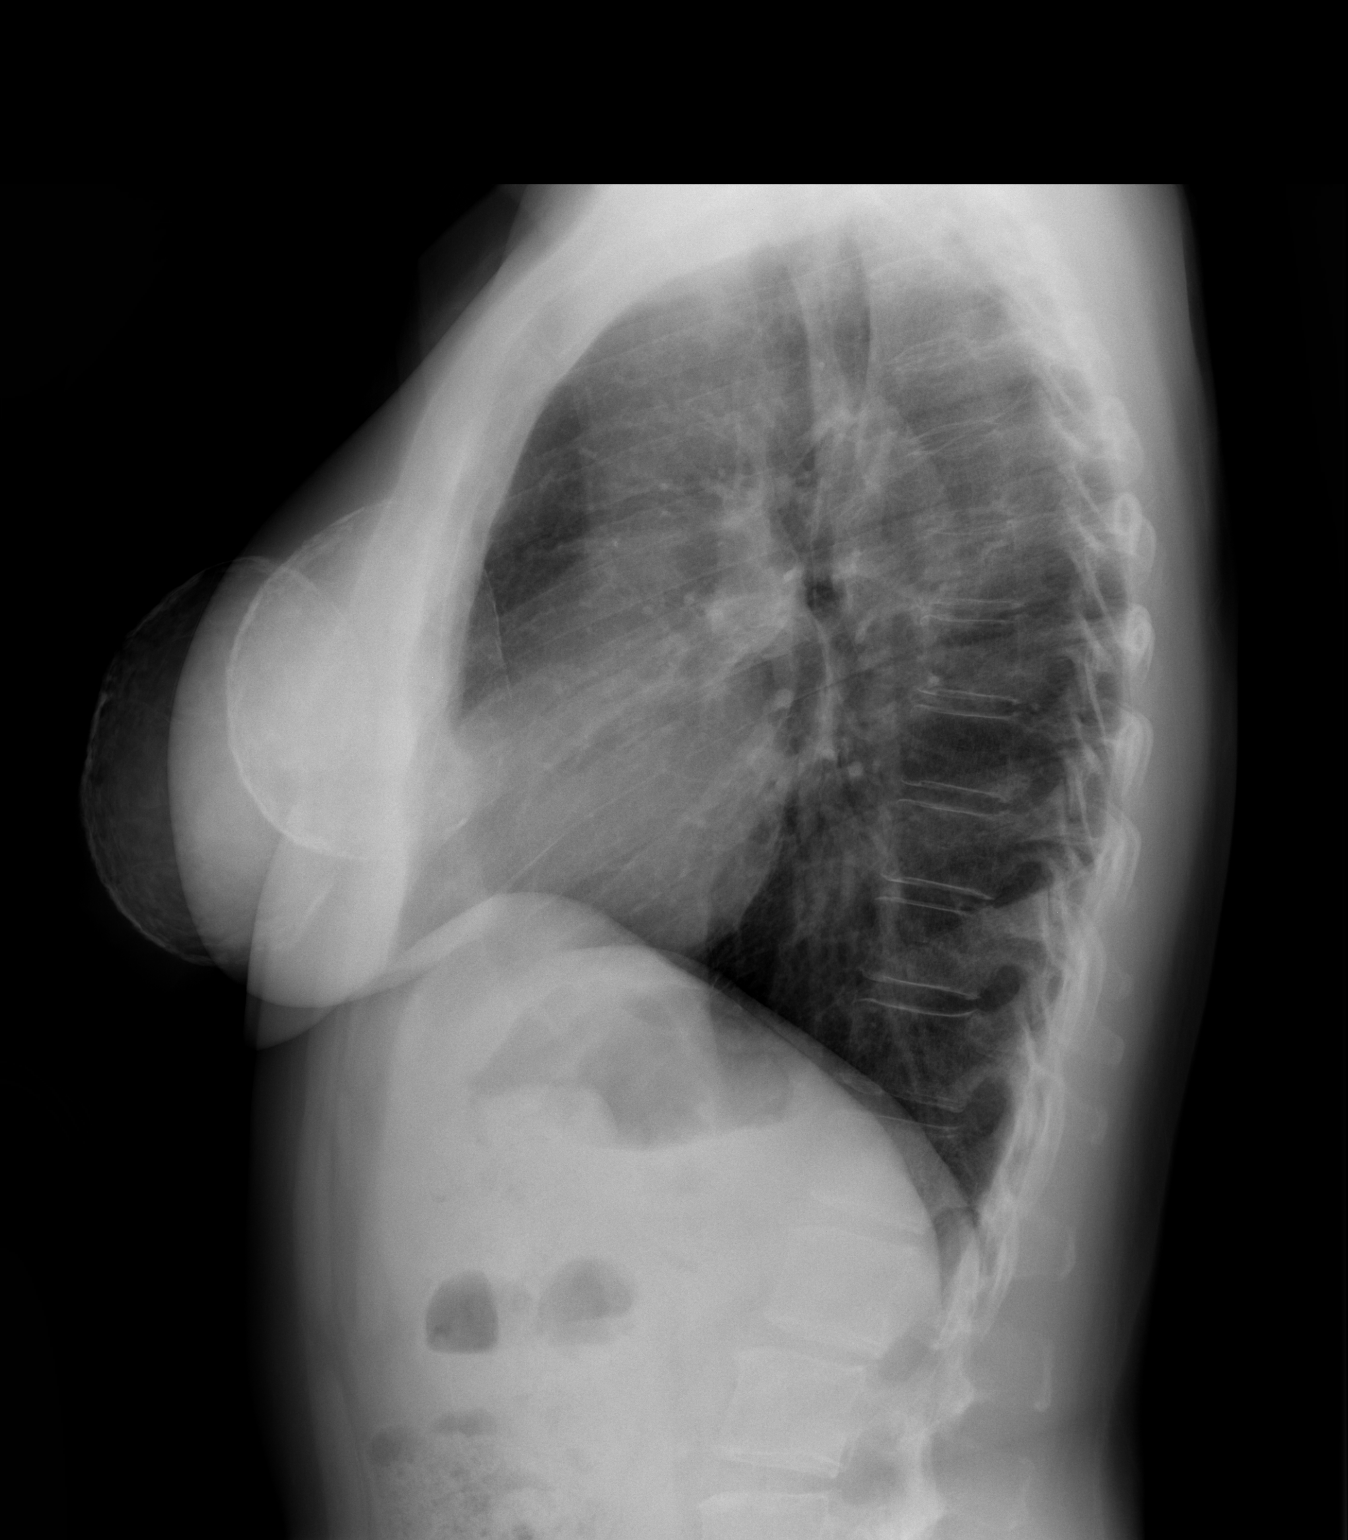

[2 of 2 positions shown; findings below may reference images not displayed]

FINDINGS: The heart size and mediastinal contours are within normal limits.
Both lungs are clear. The visualized skeletal structures are
unremarkable.
IMPRESSION: No active cardiopulmonary disease.

## 2015-03-13 ENCOUNTER — Telehealth: Payer: Self-pay | Admitting: Family Medicine

## 2015-03-13 DIAGNOSIS — Z20828 Contact with and (suspected) exposure to other viral communicable diseases: Secondary | ICD-10-CM

## 2015-03-13 MED ORDER — OSELTAMIVIR PHOSPHATE 75 MG PO CAPS: 75.0000 mg | ORAL_CAPSULE | Freq: Every day | ORAL | Status: DC

## 2015-03-13 NOTE — Telephone Encounter (Signed)
Husband her sick.  Positive Influenza B.  Tamiflu rx to pharmacy per provider directive

## 2015-04-12 ENCOUNTER — Other Ambulatory Visit: Payer: Self-pay | Admitting: Family Medicine

## 2015-04-12 MED ORDER — PRAVASTATIN SODIUM 40 MG PO TABS: 40.0000 mg | ORAL_TABLET | Freq: Every day | ORAL | Status: DC

## 2015-04-14 ENCOUNTER — Other Ambulatory Visit: Payer: BLUE CROSS/BLUE SHIELD

## 2015-04-14 DIAGNOSIS — Z79899 Other long term (current) drug therapy: Secondary | ICD-10-CM

## 2015-04-14 DIAGNOSIS — M810 Age-related osteoporosis without current pathological fracture: Secondary | ICD-10-CM

## 2015-04-14 DIAGNOSIS — E785 Hyperlipidemia, unspecified: Secondary | ICD-10-CM

## 2015-04-14 DIAGNOSIS — Z0000 Encounter for general adult medical examination without abnormal findings: Secondary | ICD-10-CM

## 2015-04-14 LAB — CBC WITH DIFFERENTIAL/PLATELET
BASOS ABS: 0.1 10*3/uL (ref 0.0–0.1)
Basophils Relative: 1 % (ref 0–1)
Eosinophils Absolute: 0.3 10*3/uL (ref 0.0–0.7)
Eosinophils Relative: 6 % — ABNORMAL HIGH (ref 0–5)
HCT: 43 % (ref 36.0–46.0)
Hemoglobin: 14.2 g/dL (ref 12.0–15.0)
LYMPHS ABS: 1.6 10*3/uL (ref 0.7–4.0)
LYMPHS PCT: 32 % (ref 12–46)
MCH: 32.6 pg (ref 26.0–34.0)
MCHC: 33 g/dL (ref 30.0–36.0)
MCV: 98.9 fL (ref 78.0–100.0)
MONO ABS: 0.5 10*3/uL (ref 0.1–1.0)
MPV: 9.4 fL (ref 8.6–12.4)
Monocytes Relative: 9 % (ref 3–12)
Neutro Abs: 2.6 10*3/uL (ref 1.7–7.7)
Neutrophils Relative %: 52 % (ref 43–77)
PLATELETS: 272 10*3/uL (ref 150–400)
RBC: 4.35 MIL/uL (ref 3.87–5.11)
RDW: 12.8 % (ref 11.5–15.5)
WBC: 5 10*3/uL (ref 4.0–10.5)

## 2015-04-14 LAB — COMPLETE METABOLIC PANEL WITH GFR
ALBUMIN: 4.3 g/dL (ref 3.5–5.2)
ALK PHOS: 66 U/L (ref 39–117)
ALT: 10 U/L (ref 0–35)
AST: 18 U/L (ref 0–37)
BUN: 19 mg/dL (ref 6–23)
CALCIUM: 9.6 mg/dL (ref 8.4–10.5)
CHLORIDE: 104 meq/L (ref 96–112)
CO2: 28 meq/L (ref 19–32)
Creat: 0.57 mg/dL (ref 0.50–1.10)
GFR, Est African American: >89 mL/min
GFR, Est Non African American: >89 mL/min
GLUCOSE: 80 mg/dL (ref 70–99)
POTASSIUM: 4.2 meq/L (ref 3.5–5.3)
SODIUM: 141 meq/L (ref 135–145)
Total Bilirubin: 0.6 mg/dL (ref 0.2–1.2)
Total Protein: 6.8 g/dL (ref 6.0–8.3)

## 2015-04-14 LAB — LIPID PANEL
Cholesterol: 182 mg/dL (ref 0–200)
HDL: 69 mg/dL (ref >=46)
LDL Cholesterol: 93 mg/dL (ref 0–99)
TRIGLYCERIDES: 98 mg/dL (ref <150)
Total CHOL/HDL Ratio: 2.6 Ratio
VLDL: 20 mg/dL (ref 0–40)

## 2015-04-14 LAB — TSH: TSH: 1.246 u[IU]/mL (ref 0.350–4.500)

## 2015-04-15 LAB — VITAMIN D 25 HYDROXY (VIT D DEFICIENCY, FRACTURES): Vit D, 25-Hydroxy: 24 ng/mL — ABNORMAL LOW (ref 30–100)

## 2015-04-17 ENCOUNTER — Other Ambulatory Visit: Payer: Self-pay

## 2015-04-18 ENCOUNTER — Ambulatory Visit (INDEPENDENT_AMBULATORY_CARE_PROVIDER_SITE_OTHER): Payer: BLUE CROSS/BLUE SHIELD | Admitting: Family Medicine

## 2015-04-18 ENCOUNTER — Encounter: Payer: Self-pay | Admitting: Family Medicine

## 2015-04-18 VITALS — BP 130/86 | HR 60 | Temp 98.5°F | Resp 16 | Ht 63.0 in | Wt 148.0 lb

## 2015-04-18 DIAGNOSIS — R0781 Pleurodynia: Secondary | ICD-10-CM | POA: Diagnosis not present

## 2015-04-18 DIAGNOSIS — Z0000 Encounter for general adult medical examination without abnormal findings: Secondary | ICD-10-CM

## 2015-04-18 MED ORDER — HYDROCODONE-ACETAMINOPHEN 5-325 MG PO TABS: 1.0000 | ORAL_TABLET | Freq: Four times a day (QID) | ORAL | Status: DC | PRN

## 2015-04-18 NOTE — Progress Notes (Signed)
Subjective:    Patient ID: Kim Fitzpatrick, female    DOB: 19-Jul-1956, 59 y.o.   MRN: [id number]  HPI Patient is a very pleasant 59 year old female who is here today for her complete physical exam. She sees her gynecologist in the summertime for her breast exam and her Pap smear. Her mammogram and colonoscopy are up-to-date. Her most recent lab work as listed below. She recently felt to ribs pop in her right side while she was leaning over into a dumpster trying to retrieve a receipt. She is now having pain on her right side. She denies any hemoptysis. She denies any shortness of breath. She does report pain with coughing and pain at night that prevents her from sleeping. Past Medical History  Diagnosis Date  . Allergic rhinitis   . Migraines   . Hyperlipidemia    Past Surgical History  Procedure Laterality Date  . Abdominal hysterectomy  2012   Current Outpatient Prescriptions on File Prior to Visit  Medication Sig Dispense Refill  . pravastatin (PRAVACHOL) 40 MG tablet Take 1 tablet (40 mg total) by mouth daily. 90 tablet 0   No current facility-administered medications on file prior to visit.   No Known Allergies History   Social History  . Marital Status: Married    Spouse Name: N/A  . Number of Children: N/A  . Years of Education: N/A   Occupational History  . Homemaker    Social History Main Topics  . Smoking status: Never Smoker   . Smokeless tobacco: Never Used  . Alcohol Use: No  . Drug Use: No  . Sexual Activity: Yes     Comment: married to [place]   Other Topics Concern  . Not on file   Social History Narrative   Family History  Problem Relation Age of Onset  . Osteoporosis Mother   . Hyperlipidemia Mother   . Hypertension Father    Lab on 04/14/2015  Component Date Value Ref Range Status  . Sodium 04/14/2015 141  135 - 145 mEq/L Final  . Potassium 04/14/2015 4.2  3.5 - 5.3 mEq/L Final  . Chloride 04/14/2015 104  96 - 112 mEq/L Final  . CO2  04/14/2015 28  19 - 32 mEq/L Final  . Glucose, Bld 04/14/2015 80  70 - 99 mg/dL Final  . BUN 04/14/2015 19  6 - 23 mg/dL Final  . Creat 04/14/2015 0.57  0.50 - 1.10 mg/dL Final  . Total Bilirubin 04/14/2015 0.6  0.2 - 1.2 mg/dL Final  . Alkaline Phosphatase 04/14/2015 66  39 - 117 U/L Final  . AST 04/14/2015 18  0 - 37 U/L Final  . ALT 04/14/2015 10  0 - 35 U/L Final  . Total Protein 04/14/2015 6.8  6.0 - 8.3 g/dL Final  . Albumin 04/14/2015 4.3  3.5 - 5.2 g/dL Final  . Calcium 04/14/2015 9.6  8.4 - 10.5 mg/dL Final  . GFR, Est African American 04/14/2015 >89   Final  . GFR, Est Non African American 04/14/2015 >89   Final   Comment:   The estimated GFR is a calculation valid for adults (>=82 years old) that uses the CKD-EPI algorithm to adjust for age and sex. It is   not to be used for children, pregnant women, hospitalized patients,    patients on dialysis, or with rapidly changing kidney function. According to the NKDEP, eGFR >89 is normal, 60-89 shows mild impairment, 30-59 shows moderate impairment, 15-29 shows severe impairment and <15 is ESRD.     Marland Kitchen  TSH 04/14/2015 1.246  0.350 - 4.500 uIU/mL Final  . Cholesterol 04/14/2015 182  0 - 200 mg/dL Final   Comment: ATP III Classification:       < 200        mg/dL        Desirable      200 - 239     mg/dL        Borderline High      >= 240        mg/dL        High     . Triglycerides 04/14/2015 98  <150 mg/dL Final  . HDL 04/14/2015 69  >=46 mg/dL Final   ** Please note change in reference range(s). **  . Total CHOL/HDL Ratio 04/14/2015 2.6   Final  . VLDL 04/14/2015 20  0 - 40 mg/dL Final  . LDL Cholesterol 04/14/2015 93  0 - 99 mg/dL Final   Comment:   Total Cholesterol/HDL Ratio:CHD Risk                        Coronary Heart Disease Risk Table                                        Men       Women          1/2 Average Risk              3.4        3.3              Average Risk              5.0        4.4           2X  Average Risk              9.6        7.1           3X Average Risk             23.4       11.0 Use the calculated Patient Ratio above and the CHD Risk table  to determine the patient's CHD Risk. ATP III Classification (LDL):       < 100        mg/dL         Optimal      100 - 129     mg/dL         Near or Above Optimal      130 - 159     mg/dL         Borderline High      160 - 189     mg/dL         High       > 190        mg/dL         Very High     . WBC 04/14/2015 5.0  4.0 - 10.5 K/uL Final  . RBC 04/14/2015 4.35  3.87 - 5.11 MIL/uL Final  . Hemoglobin 04/14/2015 14.2  12.0 - 15.0 g/dL Final  . HCT 04/14/2015 43.0  36.0 - 46.0 % Final  . MCV 04/14/2015 98.9  78.0 - 100.0 fL Final  . MCH 04/14/2015 32.6  26.0 - 34.0 pg Final  . MCHC 04/14/2015 33.0  30.0 - 36.0 g/dL Final  . RDW 04/14/2015 12.8  11.5 - 15.5 % Final  . Platelets 04/14/2015 272  150 - 400 K/uL Final  . MPV 04/14/2015 9.4  8.6 - 12.4 fL Final  . Neutrophils Relative % 04/14/2015 52  43 - 77 % Final  . Neutro Abs 04/14/2015 2.6  1.7 - 7.7 K/uL Final  . Lymphocytes Relative 04/14/2015 32  12 - 46 % Final  . Lymphs Abs 04/14/2015 1.6  0.7 - 4.0 K/uL Final  . Monocytes Relative 04/14/2015 9  3 - 12 % Final  . Monocytes Absolute 04/14/2015 0.5  0.1 - 1.0 K/uL Final  . Eosinophils Relative 04/14/2015 6* 0 - 5 % Final  . Eosinophils Absolute 04/14/2015 0.3  0.0 - 0.7 K/uL Final  . Basophils Relative 04/14/2015 1  0 - 1 % Final  . Basophils Absolute 04/14/2015 0.1  0.0 - 0.1 K/uL Final  . Smear Review 04/14/2015 Criteria for review not met   Final  . Vit D, 25-Hydroxy 04/14/2015 24* 30 - 100 ng/mL Final   Comment: Vitamin D Status           25-OH Vitamin D        Deficiency                <20 ng/mL        Insufficiency         20 - 29 ng/mL        Optimal             > or = 30 ng/mL   For 25-OH Vitamin D testing on patients on D2-supplementation and patients for whom quantitation of D2 and D3 fractions is required,  the QuestAssureD 25-OH VIT D, (D2,D3), LC/MS/MS is recommended: order code [phone] (patients > 2 yrs).       Review of Systems  All other systems reviewed and are negative.      Objective:   Physical Exam  Constitutional: She is oriented to person, place, and time. She appears well-developed and well-nourished. No distress.  HENT:  Head: Normocephalic and atraumatic.  Right Ear: External ear normal.  Left Ear: External ear normal.  Nose: Nose normal.  Mouth/Throat: Oropharynx is clear and moist. No oropharyngeal exudate.  Eyes: Conjunctivae and EOM are normal. Pupils are equal, round, and reactive to light. Right eye exhibits no discharge. Left eye exhibits no discharge. No scleral icterus.  Neck: Normal range of motion. Neck supple. No JVD present. No tracheal deviation present. No thyromegaly present.  Cardiovascular: Normal rate, regular rhythm, normal heart sounds and intact distal pulses.  Exam reveals no gallop and no friction rub.   No murmur heard. Pulmonary/Chest: Effort normal and breath sounds normal. No stridor. No respiratory distress. She has no wheezes. She has no rales. She exhibits tenderness.  Abdominal: Soft. Bowel sounds are normal. She exhibits no distension and no mass. There is no tenderness. There is no rebound and no guarding.  Musculoskeletal: Normal range of motion. She exhibits no edema or tenderness.  Lymphadenopathy:    She has no cervical adenopathy.  Neurological: She is alert and oriented to person, place, and time. She has normal reflexes. She displays normal reflexes. No cranial nerve deficit. She exhibits normal muscle tone. Coordination normal.  Skin: Skin is warm. No rash noted. She is not diaphoretic. No erythema. No pallor.  Psychiatric: She has a normal mood and affect. Her behavior is normal. Judgment and thought content normal.  Vitals reviewed.  Assessment & Plan:  Rib pain on right side - Plan: HYDROcodone-acetaminophen  (NORCO) 5-325 MG per tablet  Routine general medical examination at a health care facility  Patient's labs are outstanding. I believe she can discontinue pravastatin. Recheck cholesterol in 6 months. Immunizations are up-to-date. Cancer screening is up-to-date. I will defer her Pap smear to her gynecologist. Her mammogram is up-to-date. I did give the patient pain medication to be used sparingly as needed for rib pain. I anticipate gradual improvement over the next 2-3 weeks.

## 2015-10-12 ENCOUNTER — Ambulatory Visit (INDEPENDENT_AMBULATORY_CARE_PROVIDER_SITE_OTHER): Payer: BLUE CROSS/BLUE SHIELD | Admitting: *Deleted

## 2015-10-12 ENCOUNTER — Other Ambulatory Visit: Payer: BLUE CROSS/BLUE SHIELD

## 2015-10-12 DIAGNOSIS — E559 Vitamin D deficiency, unspecified: Secondary | ICD-10-CM

## 2015-10-12 DIAGNOSIS — Z0000 Encounter for general adult medical examination without abnormal findings: Secondary | ICD-10-CM

## 2015-10-12 DIAGNOSIS — E785 Hyperlipidemia, unspecified: Secondary | ICD-10-CM

## 2015-10-12 DIAGNOSIS — Z23 Encounter for immunization: Secondary | ICD-10-CM | POA: Diagnosis not present

## 2015-10-12 LAB — COMPLETE METABOLIC PANEL WITH GFR
ALBUMIN: 4.1 g/dL (ref 3.6–5.1)
ALK PHOS: 57 U/L (ref 33–130)
ALT: 9 U/L (ref 6–29)
AST: 19 U/L (ref 10–35)
BILIRUBIN TOTAL: 0.6 mg/dL (ref 0.2–1.2)
BUN: 15 mg/dL (ref 7–25)
CO2: 26 mmol/L (ref 20–31)
CREATININE: 0.62 mg/dL (ref 0.50–1.05)
Calcium: 9.2 mg/dL (ref 8.6–10.4)
Chloride: 104 mmol/L (ref 98–110)
GFR, Est African American: >89 mL/min (ref >=60)
GFR, Est Non African American: >89 mL/min (ref >=60)
Glucose, Bld: 81 mg/dL (ref 70–99)
POTASSIUM: 4.3 mmol/L (ref 3.5–5.3)
SODIUM: 142 mmol/L (ref 135–146)
TOTAL PROTEIN: 6.5 g/dL (ref 6.1–8.1)

## 2015-10-12 LAB — CBC WITH DIFFERENTIAL/PLATELET
BASOS PCT: 0 % (ref 0–1)
Basophils Absolute: 0 10*3/uL (ref 0.0–0.1)
EOS ABS: 0.3 10*3/uL (ref 0.0–0.7)
Eosinophils Relative: 7 % — ABNORMAL HIGH (ref 0–5)
HCT: 42 % (ref 36.0–46.0)
Hemoglobin: 14.1 g/dL (ref 12.0–15.0)
Lymphocytes Relative: 45 % (ref 12–46)
Lymphs Abs: 2.2 10*3/uL (ref 0.7–4.0)
MCH: 33.3 pg (ref 26.0–34.0)
MCHC: 33.6 g/dL (ref 30.0–36.0)
MCV: 99.1 fL (ref 78.0–100.0)
MONOS PCT: 8 % (ref 3–12)
MPV: 9.2 fL (ref 8.6–12.4)
Monocytes Absolute: 0.4 10*3/uL (ref 0.1–1.0)
Neutro Abs: 1.9 10*3/uL (ref 1.7–7.7)
Neutrophils Relative %: 40 % — ABNORMAL LOW (ref 43–77)
PLATELETS: 274 10*3/uL (ref 150–400)
RBC: 4.24 MIL/uL (ref 3.87–5.11)
RDW: 13 % (ref 11.5–15.5)
WBC: 4.8 10*3/uL (ref 4.0–10.5)

## 2015-10-12 LAB — LIPID PANEL
CHOLESTEROL: 249 mg/dL — AB (ref 125–200)
HDL: 62 mg/dL (ref >=46)
LDL Cholesterol: 165 mg/dL — ABNORMAL HIGH (ref <130)
Total CHOL/HDL Ratio: 4 Ratio (ref <=5.0)
Triglycerides: 108 mg/dL (ref <150)
VLDL: 22 mg/dL (ref <30)

## 2015-10-12 LAB — TSH: TSH: 1.137 u[IU]/mL (ref 0.350–4.500)

## 2015-10-13 LAB — VITAMIN D 25 HYDROXY (VIT D DEFICIENCY, FRACTURES): Vit D, 25-Hydroxy: 33 ng/mL (ref 30–100)

## 2015-10-19 ENCOUNTER — Ambulatory Visit (INDEPENDENT_AMBULATORY_CARE_PROVIDER_SITE_OTHER): Payer: BLUE CROSS/BLUE SHIELD | Admitting: Physician Assistant

## 2015-10-19 ENCOUNTER — Encounter: Payer: Self-pay | Admitting: Physician Assistant

## 2015-10-19 VITALS — BP 126/90 | HR 96 | Temp 98.5°F | Resp 18 | Wt 146.0 lb

## 2015-10-19 DIAGNOSIS — E559 Vitamin D deficiency, unspecified: Secondary | ICD-10-CM

## 2015-10-19 DIAGNOSIS — E785 Hyperlipidemia, unspecified: Secondary | ICD-10-CM

## 2015-10-19 DIAGNOSIS — J309 Allergic rhinitis, unspecified: Secondary | ICD-10-CM

## 2015-10-19 MED ORDER — CETIRIZINE-PSEUDOEPHEDRINE ER 5-120 MG PO TB12: 1.0000 | ORAL_TABLET | Freq: Two times a day (BID) | ORAL | Status: AC

## 2015-10-19 MED ORDER — VITAMIN D (CHOLECALCIFEROL) 25 MCG (1000 UT) PO TABS: 1.0000 | ORAL_TABLET | Freq: Every day | ORAL | Status: DC

## 2015-10-19 MED ORDER — PRAVASTATIN SODIUM 20 MG PO TABS: 20.0000 mg | ORAL_TABLET | Freq: Every day | ORAL | Status: DC

## 2015-10-19 NOTE — Progress Notes (Signed)
Patient ID: FRANCESSCA Fitzpatrick MRN: [id number], DOB: Dec 20, 1956, 59 y.o. Date of Encounter: @DATE @  Chief Complaint:  Chief Complaint  Patient presents with  . 6 mth check up    has had labs    HPI: 59 y.o. year old white female  presents for routine 6 month f/u OV.  Today she states that she only has one thing to report that has happened over the last 6 months. Says that her husband had a cast on his foot/leg put on from June until August. Says that in July she developed episodes of sneezing. She took 2 Benadryl and the symptoms resolved. Says that this happened again in August. Again in August had episodes of sneezing take Benadryl twice and the symptoms would resolve. At that point, the only thing that she could think of that was different was her husband having that cast and she thought maybe something to do with that was she was allergic to. However even after he had the cast off and that was all resolved again in September she had further episodes of sneezing. At that point the only other thing she can think of that was new was that she had adequate vitamin D. Therefore at that point she thought possibly vitamin D supplement was causing it so she quit the vitamin D. However she has continued to have this sneezing. It has continued to be controlled whenever she takes Benadryl. However she says the Benadryl makes her drowsy and groggy.  Also reviewed that at her last visit which was for a physical with Dr. Dennard Schaumann 04/18/15--- he had told her she could stop the pravastatin. Therefore that was stopped at that time. She recently came and had fasting lab work performed 10/12/15. No other complaints or concerns today.   Past Medical History  Diagnosis Date  . Allergic rhinitis   . Migraines   . Hyperlipidemia      Home Meds: Outpatient Prescriptions Prior to Visit  Medication Sig Dispense Refill  . diphenhydramine-acetaminophen (TYLENOL PM) 25-500 MG TABS Take 1 tablet by mouth  at bedtime.    Marland Kitchen HYDROcodone-acetaminophen (NORCO) 5-325 MG per tablet Take 1 tablet by mouth every 6 (six) hours as needed for moderate pain. 30 tablet 0  . pravastatin (PRAVACHOL) 40 MG tablet Take 1 tablet (40 mg total) by mouth daily. (Patient not taking: Reported on 10/19/2015) 90 tablet 0   No facility-administered medications prior to visit.    Allergies: No Known Allergies  Social History   Social History  . Marital Status: Married    Spouse Name: N/A  . Number of Children: N/A  . Years of Education: N/A   Occupational History  . Homemaker    Social History Main Topics  . Smoking status: Never Smoker   . Smokeless tobacco: Never Used  . Alcohol Use: No  . Drug Use: No  . Sexual Activity: Yes     Comment: married to Kim Fitzpatrick   Other Topics Concern  . Not on file   Social History Narrative    Family History  Problem Relation Age of Onset  . Osteoporosis Mother   . Hyperlipidemia Mother   . Hypertension Father      Review of Systems:  See HPI for pertinent ROS. All other ROS negative.    Physical Exam: Blood pressure 126/90, pulse 96, temperature 98.5 F (36.9 C), temperature source Oral, resp. rate 18, weight 146 lb (66.225 kg)., Body mass index is 25.87 kg/(m^2). General: WNWD WF. Appears in  no acute distress. Neck: Supple. No thyromegaly. No lymphadenopathy. Lungs: Clear bilaterally to auscultation without wheezes, rales, or rhonchi. Breathing is unlabored. Heart: RRR with S1 S2. No murmurs, rubs, or gallops. Abdomen: Soft, non-tender, non-distended with normoactive bowel sounds. No hepatomegaly. No rebound/guarding. No obvious abdominal masses. Musculoskeletal:  Strength and tone normal for age. Extremities/Skin: Warm and dry.  Neuro: Alert and oriented X 3. Moves all extremities spontaneously. Gait is normal. CNII-XII grossly in tact. Psych:  Responds to questions appropriately with a normal affect.     ASSESSMENT AND PLAN:  59 y.o. year old female  with  Reviewed labs performed 10/12/15  Lipid panel shows LDL now at 165.----- 04/18/15 LDL was down at 93. CBC normal TSH normal CMET normal Vitamin D 33------- this was 24 at lab 04/18/15   1. Hyperlipidemia Lipid panel shows LDL now at 165.----- 04/18/15 LDL was down at 93. Today reviewed that she does not have a personal history of CAD, PAD, diabetes, or smoking. She has no premature family history of CAD. However I still feel that LDL 165 is a little high. Told her that we would restart pravastatin 20 mg rather than the prior dose of 40 mg.  - pravastatin (PRAVACHOL) 20 MG tablet; Take 1 tablet (20 mg total) by mouth daily.  Dispense: 90 tablet; Refill: 3  2. Vitamin D deficiency She states that she was taking vitamin D at 1000 units daily prior to this past month when she stopped it because she thought it might be causing her sneezing. At this time she is going to resume the vitamin D at that that same dose of 1000 units daily - Vitamin D, Cholecalciferol, 1000 UNITS TABS; Take 1 tablet by mouth daily.  Dispense: 30 tablet; Refill: 11  3. Allergic rhinitis, unspecified allergic rhinitis type Recommend she try Zyrtec-D for her sneezing symptoms. - cetirizine-pseudoephedrine (ZYRTEC-D) 5-120 MG tablet; Take 1 tablet by mouth 2 (two) times daily.  Dispense: 30 tablet; Refill: 11  Follow-up office visit and fasting lab 6 months to recheck FLP LFT and vitamin D Hollow up sooner if needed.  Signed, [id number] [place], [place], [facility] 10/19/2015 8:25 AM

## 2015-11-13 ENCOUNTER — Encounter: Payer: Self-pay | Admitting: Physician Assistant

## 2015-11-13 ENCOUNTER — Ambulatory Visit (INDEPENDENT_AMBULATORY_CARE_PROVIDER_SITE_OTHER): Payer: BLUE CROSS/BLUE SHIELD | Admitting: Physician Assistant

## 2015-11-13 VITALS — BP 144/92 | HR 72 | Temp 98.8°F | Resp 18 | Wt 148.0 lb

## 2015-11-13 DIAGNOSIS — J309 Allergic rhinitis, unspecified: Secondary | ICD-10-CM | POA: Diagnosis not present

## 2015-11-13 MED ORDER — MONTELUKAST SODIUM 10 MG PO TABS: 10.0000 mg | ORAL_TABLET | Freq: Every day | ORAL | Status: DC

## 2015-11-13 NOTE — Progress Notes (Signed)
Patient ID: Kim Fitzpatrick MRN: [id number], DOB: September 10, 1956, 59 y.o. Date of Encounter: 11/13/2015, 3:29 PM    Chief Complaint:  Chief Complaint  Patient presents with  . c/o lump in throat, diff swallowing     HPI: 59 y.o. year old white female says that for 40 years she has had problems where she would have a day of sneezing followed by weeks of coughing. Says that this used to just happen for 1 or 2 times out of the year and the remainder of the year she would be okay without the symptoms. Says that recently it has gotten to be where this is just continuous. Have a couple of days of sneezing followed by continuous cough. She had an office visit with me recently at which time we added Ceretec and she says that that has helped. However has not completely relieved her symptoms. Also says that this past Saturday she looked in her throat and saw something that   looked like a pearl" in the left of her throat. Today a looked in her throat did not see any such object so then she looked in the meter and used her finger and fingernail to go right towards the spot that she has been seeing. That spot was a lot larger Saturday and it has gone down. What I see is her left tonsil with 8 crypt and a small little papule next to it that is very tiny and minimally raised about 1 mm.  She also discusses that she has seen an allergist in the past and had allergy testing that was negative.     Home Meds:   Outpatient Prescriptions Prior to Visit  Medication Sig Dispense Refill  . cetirizine-pseudoephedrine (ZYRTEC-D) 5-120 MG tablet Take 1 tablet by mouth 2 (two) times daily. 30 tablet 11  . diphenhydramine-acetaminophen (TYLENOL PM) 25-500 MG TABS Take 1 tablet by mouth at bedtime.    . pravastatin (PRAVACHOL) 20 MG tablet Take 1 tablet (20 mg total) by mouth daily. 90 tablet 3  . Vitamin D, Cholecalciferol, 1000 UNITS TABS Take 1 tablet by mouth daily. 30 tablet 11   No facility-administered  medications prior to visit.    Allergies: No Known Allergies    Review of Systems: See HPI for pertinent ROS. All other ROS negative.    Physical Exam: Blood pressure 144/92, pulse 72, temperature 98.8 F (37.1 C), temperature source Oral, resp. rate 18, weight 148 lb (67.132 kg)., Body mass index is 26.22 kg/(m^2). General: WNWD WF.  Appears in no acute distress. HEENT: Normocephalic, atraumatic, eyes without discharge, sclera non-icteric, nares are without discharge. Bilateral auditory canals clear, TM's are without perforation, pearly grey and translucent with reflective cone of light bilaterally. Oral cavity moist, posterior pharynx without exudate, erythema, peritonsillar abscess. Not examine her throat I see no"pearly" mass. He then goes to the meter were in the exam room and uses her little finger which has a long fingernail and points it right to the spot that she has noticed. She says that it was looking like a pearl on Saturday but that has gone down now. At that spot that she is.her fingernail I just see her left tonsil which has some small crypts with some adjacent minimally raised 1 mm papule. No other mass or abnormal finding.  Neck: Supple. No thyromegaly. No lymphadenopathy. Lungs: Clear bilaterally to auscultation without wheezes, rales, or rhonchi. Breathing is unlabored. Heart: Regular rhythm. No murmurs, rubs, or gallops. Msk:  Strength and tone  normal for age. Extremities/Skin: Warm and dry. Neuro: Alert and oriented X 3. Moves all extremities spontaneously. Gait is normal. CNII-XII grossly in tact. Psych:  Responds to questions appropriately with a normal affect.     ASSESSMENT AND PLAN:  59 y.o. year old female with  1. Allergic rhinitis, unspecified allergic rhinitis type Told her to take the Zyrtec daily. Also take Singulair daily. IFollow up if symptoms not controlled with a combination of both of these medications on a daily basis. - montelukast (SINGULAIR) 10  MG tablet; Take 1 tablet (10 mg total) by mouth at bedtime.  Dispense: 30 tablet; Refill: 3   Signed, [address] [place], [place], [facility] 11/13/2015 3:29 PM

## 2016-02-05 ENCOUNTER — Ambulatory Visit (INDEPENDENT_AMBULATORY_CARE_PROVIDER_SITE_OTHER): Payer: BLUE CROSS/BLUE SHIELD | Admitting: Family Medicine

## 2016-02-05 ENCOUNTER — Encounter: Payer: Self-pay | Admitting: Family Medicine

## 2016-02-05 VITALS — BP 108/80 | HR 88 | Temp 98.7°F | Resp 20 | Wt 149.0 lb

## 2016-02-05 DIAGNOSIS — G44219 Episodic tension-type headache, not intractable: Secondary | ICD-10-CM

## 2016-02-05 MED ORDER — BUTALBITAL-APAP-CAFFEINE 50-325-40 MG PO TABS: 1.0000 | ORAL_TABLET | Freq: Four times a day (QID) | ORAL | Status: DC | PRN

## 2016-02-05 NOTE — Progress Notes (Signed)
Subjective:    Patient ID: Kim Fitzpatrick, female    DOB: 1956/03/01, 60 y.o.   MRN: [id number]  HPI Symptoms began Friday with a constant pressure-like headache over her right temple and her right parietal lobe. Skin even hurts to touch it. She is sore in that area. She denies any blurry vision. She denies any photophobia. She denies any photophobia. She denies any falls or injuries or head trauma. She denies any loss of consciousness. She denies any neurologic deficits. She denies any numbness tingling or weakness in any extremity. She denies any sinus pain or fevers. She denies any diplopia. She denies any nausea or vomiting.  Past medical history is significant for migraines. She denies any meningismus Past Medical History  Diagnosis Date  . Allergic rhinitis   . Migraines   . Hyperlipidemia    Past Surgical History  Procedure Laterality Date  . Abdominal hysterectomy  2012   Current Outpatient Prescriptions on File Prior to Visit  Medication Sig Dispense Refill  . cetirizine-pseudoephedrine (ZYRTEC-D) 5-120 MG tablet Take 1 tablet by mouth 2 (two) times daily. 30 tablet 11  . diphenhydramine-acetaminophen (TYLENOL PM) 25-500 MG TABS Take 1 tablet by mouth at bedtime.    . montelukast (SINGULAIR) 10 MG tablet Take 1 tablet (10 mg total) by mouth at bedtime. 30 tablet 3  . pravastatin (PRAVACHOL) 20 MG tablet Take 1 tablet (20 mg total) by mouth daily. 90 tablet 3  . Vitamin D, Cholecalciferol, 1000 UNITS TABS Take 1 tablet by mouth daily. 30 tablet 11   No current facility-administered medications on file prior to visit.   No Known Allergies Social History   Social History  . Marital Status: Married    Spouse Name: N/A  . Number of Children: N/A  . Years of Education: N/A   Occupational History  . Homemaker    Social History Main Topics  . Smoking status: Never Smoker   . Smokeless tobacco: Never Used  . Alcohol Use: No  . Drug Use: No  . Sexual Activity: Yes   Comment: married to [place]   Other Topics Concern  . Not on file   Social History Narrative      Review of Systems  All other systems reviewed and are negative.      Objective:   Physical Exam  Constitutional: She is oriented to person, place, and time. She appears well-developed and well-nourished. No distress.  HENT:  Head: Normocephalic and atraumatic.  Right Ear: External ear normal.  Left Ear: External ear normal.  Nose: Nose normal.  Mouth/Throat: Oropharynx is clear and moist. No oropharyngeal exudate.  Eyes: Conjunctivae and EOM are normal. Pupils are equal, round, and reactive to light. Right eye exhibits no discharge. Left eye exhibits no discharge. No scleral icterus.  Neck: Normal range of motion. Neck supple.  Cardiovascular: Normal rate and regular rhythm.   Pulmonary/Chest: Effort normal and breath sounds normal.  Lymphadenopathy:    She has no cervical adenopathy.  Neurological: She is alert and oriented to person, place, and time. She has normal reflexes. She displays normal reflexes. No cranial nerve deficit. She exhibits normal muscle tone. Coordination normal.  Skin: She is not diaphoretic.  Psychiatric: She has a normal mood and affect. Her behavior is normal. Judgment and thought content normal.  Vitals reviewed.         Assessment & Plan:  Episodic tension-type headache, not intractable - Plan: butalbital-acetaminophen-caffeine (FIORICET) 50-325-40 MG tablet  Symptoms are consistent with a  tension type headache or possibly an atypical migraine. Begin Fioricet 1-2 tablets every 6 hours as needed for headache. Recheck in 48 hours if no better or sooner if worse

## 2016-02-06 ENCOUNTER — Ambulatory Visit (INDEPENDENT_AMBULATORY_CARE_PROVIDER_SITE_OTHER): Payer: BLUE CROSS/BLUE SHIELD | Admitting: Family Medicine

## 2016-02-06 ENCOUNTER — Encounter: Payer: Self-pay | Admitting: Family Medicine

## 2016-02-06 ENCOUNTER — Telehealth: Payer: Self-pay | Admitting: Family Medicine

## 2016-02-06 VITALS — BP 108/76 | HR 108 | Temp 97.9°F | Resp 18 | Wt 149.0 lb

## 2016-02-06 DIAGNOSIS — B029 Zoster without complications: Secondary | ICD-10-CM

## 2016-02-06 MED ORDER — VALACYCLOVIR HCL 1 G PO TABS: 1000.0000 mg | ORAL_TABLET | Freq: Three times a day (TID) | ORAL | Status: AC

## 2016-02-06 NOTE — Telephone Encounter (Signed)
Pt coming in today at 2:00pm

## 2016-02-06 NOTE — Telephone Encounter (Signed)
Could it be shingles since it is only the right side of her face and that is where she is hurting.  NTBS

## 2016-02-06 NOTE — Telephone Encounter (Signed)
PATIENT LETTING YOU KNOW THAT SHE IS POSSIBLE HAVING AN ALLERGIC RXN TO THE FIORICET THAT WAS PRESCRIBED FOR HER YESTERDAY, SHE IS VERY ITCHY ON RIGHT SIDE OF FACE ;AND WOULD LIKE TO KNOW WHAT SHE SHOULD DO  [phone]

## 2016-02-06 NOTE — Progress Notes (Signed)
Subjective:    Patient ID: Kim Fitzpatrick, female    DOB: Jun 12, 1956, 60 y.o.   MRN: [id number]  HPI 02/05/16 Symptoms began Friday with a constant pressure-like headache over her right temple and her right parietal lobe. Skin even hurts to touch it. She is sore in that area. She denies any blurry vision. She denies any photophobia. She denies any photophobia. She denies any falls or injuries or head trauma. She denies any loss of consciousness. She denies any neurologic deficits. She denies any numbness tingling or weakness in any extremity. She denies any sinus pain or fevers. She denies any diplopia. She denies any nausea or vomiting.  Past medical history is significant for migraines. She denies any meningismus.  At that time, my plan was: Symptoms are consistent with a tension type headache or possibly an atypical migraine. Begin Fioricet 1-2 tablets every 6 hours as needed for headache. Recheck in 48 hours if no better or sooner if worse  02/06/16 Patient has had a headache since Friday. The headache is better today however she reports itching and tingling on her skin just on the right side of her face in the distribution of the trigeminal nerve. There is no visible rash. There is no numbness. It is a neuropathic  itching sensation isolated to the fifth cranial nerve. It does not extend over the entire head. It is isolated to the right side in the area where she was experiencing headache. However her neurologic exam is normal today. There is no evidence of stroke or any type of Bell's palsy. There is also no visible sign of shingles. Past Medical History  Diagnosis Date  . Allergic rhinitis   . Migraines   . Hyperlipidemia    Past Surgical History  Procedure Laterality Date  . Abdominal hysterectomy  2012   Current Outpatient Prescriptions on File Prior to Visit  Medication Sig Dispense Refill  . butalbital-acetaminophen-caffeine (FIORICET) 50-325-40 MG tablet Take 1-2 tablets by mouth  every 6 (six) hours as needed for headache. 20 tablet 0  . cetirizine-pseudoephedrine (ZYRTEC-D) 5-120 MG tablet Take 1 tablet by mouth 2 (two) times daily. 30 tablet 11  . diphenhydramine-acetaminophen (TYLENOL PM) 25-500 MG TABS Take 1 tablet by mouth at bedtime.    . montelukast (SINGULAIR) 10 MG tablet Take 1 tablet (10 mg total) by mouth at bedtime. 30 tablet 3  . pravastatin (PRAVACHOL) 20 MG tablet Take 1 tablet (20 mg total) by mouth daily. 90 tablet 3  . Vitamin D, Cholecalciferol, 1000 UNITS TABS Take 1 tablet by mouth daily. 30 tablet 11   No current facility-administered medications on file prior to visit.   No Known Allergies Social History   Social History  . Marital Status: Married    Spouse Name: N/A  . Number of Children: N/A  . Years of Education: N/A   Occupational History  . Homemaker    Social History Main Topics  . Smoking status: Never Smoker   . Smokeless tobacco: Never Used  . Alcohol Use: No  . Drug Use: No  . Sexual Activity: Yes     Comment: married to [place]   Other Topics Concern  . Not on file   Social History Narrative      Review of Systems  All other systems reviewed and are negative.      Objective:   Physical Exam  Constitutional: She is oriented to person, place, and time. She appears well-developed and well-nourished. No distress.  HENT:  Head:  Normocephalic and atraumatic.  Right Ear: External ear normal.  Left Ear: External ear normal.  Nose: Nose normal.  Mouth/Throat: Oropharynx is clear and moist. No oropharyngeal exudate.  Eyes: Conjunctivae and EOM are normal. Pupils are equal, round, and reactive to light. Right eye exhibits no discharge. Left eye exhibits no discharge. No scleral icterus.  Neck: Normal range of motion. Neck supple.  Cardiovascular: Normal rate and regular rhythm.   Pulmonary/Chest: Effort normal and breath sounds normal.  Lymphadenopathy:    She has no cervical adenopathy.  Neurological: She is  alert and oriented to person, place, and time. She has normal reflexes. No cranial nerve deficit. She exhibits normal muscle tone. Coordination normal.  Skin: She is not diaphoretic.  Psychiatric: She has a normal mood and affect. Her behavior is normal. Judgment and thought content normal.  Vitals reviewed.  Visibly there is no rash or swelling or abnormality on the patient's face. There is only the subjective complaint of itching and tingling in the skin       Assessment & Plan:  Shingles - Plan: valACYclovir (VALTREX) 1000 MG tablet  I do not believe that this is an allergic reaction to Fioricet. The itching is isolated just to the right side of the face in the distribution of the fifth cranial nerve. Therefore I believe her symptoms may be a sign of early onset of shingles. I am concerned she may also develop Bell's palsy. I recommended that we go ahead and be conservative and treat with Valtrex 1 g by mouth 3 times a day for 7 days in case this is shingles. If it is shingles I anticipate that the rash should develop by tomorrow. I do not believe this is allergic reaction and therefore I'm fine with the patient continuing to take Fioricet as needed for the headache. Recheck in 48 hours if no better or immediately if symptoms change or worsen

## 2016-02-06 NOTE — Telephone Encounter (Signed)
I saw pt and she is breaking out on her right side face with rash and swelling. Pt was very anxious and worried, no SOB, No pain. I advised pt to stop taking the medication until hears back from me. MD please advise

## 2016-04-22 ENCOUNTER — Ambulatory Visit: Payer: BLUE CROSS/BLUE SHIELD | Admitting: Physician Assistant

## 2016-06-11 ENCOUNTER — Other Ambulatory Visit: Payer: BLUE CROSS/BLUE SHIELD

## 2016-06-11 DIAGNOSIS — E559 Vitamin D deficiency, unspecified: Secondary | ICD-10-CM

## 2016-06-11 DIAGNOSIS — E785 Hyperlipidemia, unspecified: Secondary | ICD-10-CM

## 2016-06-11 DIAGNOSIS — Z0000 Encounter for general adult medical examination without abnormal findings: Secondary | ICD-10-CM

## 2016-06-11 DIAGNOSIS — Z79899 Other long term (current) drug therapy: Secondary | ICD-10-CM

## 2016-06-11 LAB — CBC WITH DIFFERENTIAL/PLATELET
Basophils Absolute: 0 cells/uL (ref 0–200)
Basophils Relative: 0 %
EOS PCT: 3 %
Eosinophils Absolute: 165 cells/uL (ref 15–500)
HCT: 39.7 % (ref 35.0–45.0)
HEMOGLOBIN: 12.9 g/dL (ref 12.0–15.0)
LYMPHS ABS: 1650 {cells}/uL (ref 850–3900)
Lymphocytes Relative: 30 %
MCH: 31.7 pg (ref 27.0–33.0)
MCHC: 32.5 g/dL (ref 32.0–36.0)
MCV: 97.5 fL (ref 80.0–100.0)
MONOS PCT: 9 %
MPV: 9 fL (ref 7.5–12.5)
Monocytes Absolute: 495 cells/uL (ref 200–950)
NEUTROS PCT: 58 %
Neutro Abs: 3190 cells/uL (ref 1500–7800)
PLATELETS: 266 10*3/uL (ref 140–400)
RBC: 4.07 MIL/uL (ref 3.80–5.10)
RDW: 14.7 % (ref 11.0–15.0)
WBC: 5.5 10*3/uL (ref 3.8–10.8)

## 2016-06-11 LAB — COMPLETE METABOLIC PANEL WITH GFR
ALBUMIN: 4.1 g/dL (ref 3.6–5.1)
ALK PHOS: 61 U/L (ref 33–130)
ALT: 9 U/L (ref 6–29)
AST: 18 U/L (ref 10–35)
BUN: 20 mg/dL (ref 7–25)
CO2: 28 mmol/L (ref 20–31)
Calcium: 9.2 mg/dL (ref 8.6–10.4)
Chloride: 107 mmol/L (ref 98–110)
Creat: 0.63 mg/dL (ref 0.50–0.99)
GFR, Est African American: >89 mL/min (ref >=60)
Glucose, Bld: 83 mg/dL (ref 70–99)
Potassium: 4.7 mmol/L (ref 3.5–5.3)
Sodium: 142 mmol/L (ref 135–146)
TOTAL PROTEIN: 6.5 g/dL (ref 6.1–8.1)
Total Bilirubin: 0.5 mg/dL (ref 0.2–1.2)

## 2016-06-11 LAB — LIPID PANEL
Cholesterol: 190 mg/dL (ref 125–200)
HDL: 78 mg/dL (ref >=46)
LDL Cholesterol: 93 mg/dL (ref <130)
TRIGLYCERIDES: 94 mg/dL (ref <150)
Total CHOL/HDL Ratio: 2.4 Ratio (ref <=5.0)
VLDL: 19 mg/dL (ref <30)

## 2016-06-11 LAB — TSH: TSH: 1.16 mIU/L

## 2016-06-12 LAB — VITAMIN D 25 HYDROXY (VIT D DEFICIENCY, FRACTURES): VIT D 25 HYDROXY: 46 ng/mL (ref 30–100)

## 2016-06-18 ENCOUNTER — Encounter: Payer: BLUE CROSS/BLUE SHIELD | Admitting: Family Medicine

## 2016-07-15 ENCOUNTER — Ambulatory Visit (INDEPENDENT_AMBULATORY_CARE_PROVIDER_SITE_OTHER): Payer: BLUE CROSS/BLUE SHIELD | Admitting: Family Medicine

## 2016-07-15 ENCOUNTER — Encounter: Payer: Self-pay | Admitting: Family Medicine

## 2016-07-15 VITALS — BP 142/80 | HR 98 | Temp 99.5°F | Resp 16 | Ht 63.0 in | Wt 150.0 lb

## 2016-07-15 DIAGNOSIS — Z0000 Encounter for general adult medical examination without abnormal findings: Secondary | ICD-10-CM | POA: Diagnosis not present

## 2016-07-15 DIAGNOSIS — E785 Hyperlipidemia, unspecified: Secondary | ICD-10-CM | POA: Diagnosis not present

## 2016-07-15 DIAGNOSIS — N3281 Overactive bladder: Secondary | ICD-10-CM | POA: Diagnosis not present

## 2016-07-15 MED ORDER — OXYBUTYNIN CHLORIDE 5 MG PO TABS: 5.0000 mg | ORAL_TABLET | Freq: Every day | ORAL | Status: DC

## 2016-07-15 NOTE — Progress Notes (Signed)
Subjective:    Patient ID: Kim Fitzpatrick, female    DOB: November 27, 1956, 60 y.o.   MRN: [id number]  HPI  Patient is a very pleasant 60 year old female who is here today for her complete physical exam. She sees her gynecologist in the summertime for her breast exam and her Pap smear. Her mammogram and colonoscopy (8 years ago) are up-to-date. Her most recent lab work as listed below: No visits with results within 1 Month(s) from this visit. Latest known visit with results is:  Lab on 06/11/2016  Component Date Value Ref Range Status  . Sodium 06/11/2016 142  135 - 146 mmol/L Final  . Potassium 06/11/2016 4.7  3.5 - 5.3 mmol/L Final  . Chloride 06/11/2016 107  98 - 110 mmol/L Final  . CO2 06/11/2016 28  20 - 31 mmol/L Final  . Glucose, Bld 06/11/2016 83  70 - 99 mg/dL Final  . BUN 06/11/2016 20  7 - 25 mg/dL Final  . Creat 06/11/2016 0.63  0.50 - 0.99 mg/dL Final   Comment:   For patients > or = 60 years of age: The upper reference limit for Creatinine is approximately 13% higher for people identified as African-American.     . Total Bilirubin 06/11/2016 0.5  0.2 - 1.2 mg/dL Final  . Alkaline Phosphatase 06/11/2016 61  33 - 130 U/L Final  . AST 06/11/2016 18  10 - 35 U/L Final  . ALT 06/11/2016 9  6 - 29 U/L Final  . Total Protein 06/11/2016 6.5  6.1 - 8.1 g/dL Final  . Albumin 06/11/2016 4.1  3.6 - 5.1 g/dL Final  . Calcium 06/11/2016 9.2  8.6 - 10.4 mg/dL Final  . GFR, Est African American 06/11/2016 >89  >=60 mL/min Final  . GFR, Est Non African American 06/11/2016 >89  >=60 mL/min Final  . TSH 06/11/2016 1.16   Final   Comment:   Reference Range   > or = 20 Years  0.40-4.50   Pregnancy Range First trimester  0.26-2.66 Second trimester 0.55-2.73 Third trimester  0.43-2.91     . Cholesterol 06/11/2016 190  125 - 200 mg/dL Final  . Triglycerides 06/11/2016 94  <150 mg/dL Final  . HDL 06/11/2016 78  >=46 mg/dL Final  . Total CHOL/HDL Ratio 06/11/2016 2.4  <=5.0 Ratio  Final  . VLDL 06/11/2016 19  <30 mg/dL Final  . LDL Cholesterol 06/11/2016 93  <130 mg/dL Final   Comment:   Total Cholesterol/HDL Ratio:CHD Risk                        Coronary Heart Disease Risk Table                                        Men       Women          1/2 Average Risk              3.4        3.3              Average Risk              5.0        4.4           2X Average Risk              9.6  7.1           3X Average Risk             23.4       11.0 Use the calculated Patient Ratio above and the CHD Risk table  to determine the patient's CHD Risk.   . WBC 06/11/2016 5.5  3.8 - 10.8 K/uL Final  . RBC 06/11/2016 4.07  3.80 - 5.10 MIL/uL Final  . Hemoglobin 06/11/2016 12.9  12.0 - 15.0 g/dL Final  . HCT 06/11/2016 39.7  35.0 - 45.0 % Final  . MCV 06/11/2016 97.5  80.0 - 100.0 fL Final  . MCH 06/11/2016 31.7  27.0 - 33.0 pg Final  . MCHC 06/11/2016 32.5  32.0 - 36.0 g/dL Final  . RDW 06/11/2016 14.7  11.0 - 15.0 % Final  . Platelets 06/11/2016 266  140 - 400 K/uL Final  . MPV 06/11/2016 9.0  7.5 - 12.5 fL Final  . Neutro Abs 06/11/2016 3190  1500 - 7800 cells/uL Final  . Lymphs Abs 06/11/2016 1650  850 - 3900 cells/uL Final  . Monocytes Absolute 06/11/2016 495  200 - 950 cells/uL Final  . Eosinophils Absolute 06/11/2016 165  15 - 500 cells/uL Final  . Basophils Absolute 06/11/2016 0  0 - 200 cells/uL Final  . Neutrophils Relative % 06/11/2016 58   Final  . Lymphocytes Relative 06/11/2016 30   Final  . Monocytes Relative 06/11/2016 9   Final  . Eosinophils Relative 06/11/2016 3   Final  . Basophils Relative 06/11/2016 0   Final  . Smear Review 06/11/2016 Criteria for review not met   Final   ** Please note change in unit of measure and reference range(s). **  . Vit D, 25-Hydroxy 06/11/2016 46  30 - 100 ng/mL Final   Comment: Vitamin D Status           25-OH Vitamin D        Deficiency                <20 ng/mL        Insufficiency         20 - 29 ng/mL         Optimal             > or = 30 ng/mL   For 25-OH Vitamin D testing on patients on D2-supplementation and patients for whom quantitation of D2 and D3 fractions is required, the QuestAssureD 25-OH VIT D, (D2,D3), LC/MS/MS is recommended: order code [id number] (patients > 2 yrs).     Past Medical History  Diagnosis Date  . Allergic rhinitis   . Migraines   . Hyperlipidemia    Past Surgical History  Procedure Laterality Date  . Abdominal hysterectomy  2012   Current Outpatient Prescriptions on File Prior to Visit  Medication Sig Dispense Refill  . cetirizine-pseudoephedrine (ZYRTEC-D) 5-120 MG tablet Take 1 tablet by mouth 2 (two) times daily. 30 tablet 11  . diphenhydramine-acetaminophen (TYLENOL PM) 25-500 MG TABS Take 1 tablet by mouth at bedtime.    . pravastatin (PRAVACHOL) 20 MG tablet Take 1 tablet (20 mg total) by mouth daily. 90 tablet 3  . valACYclovir (VALTREX) 1000 MG tablet Take 1 tablet (1,000 mg total) by mouth 3 (three) times daily. 21 tablet 0  . Vitamin D, Cholecalciferol, 1000 UNITS TABS Take 1 tablet by mouth daily. 30 tablet 11   No current facility-administered medications on file prior to visit.   No Known  Allergies Social History   Social History  . Marital Status: Married    Spouse Name: N/A  . Number of Children: N/A  . Years of Education: N/A   Occupational History  . Homemaker    Social History Main Topics  . Smoking status: Never Smoker   . Smokeless tobacco: Never Used  . Alcohol Use: No  . Drug Use: No  . Sexual Activity: Yes     Comment: married to [place]   Other Topics Concern  . Not on file   Social History Narrative   Family History  Problem Relation Age of Onset  . Osteoporosis Mother   . Hyperlipidemia Mother   . Hypertension Father      Review of Systems  All other systems reviewed and are negative.      Objective:   Physical Exam  Constitutional: She is oriented to person, place, and time. She appears well-developed and  well-nourished. No distress.  HENT:  Head: Normocephalic and atraumatic.  Right Ear: External ear normal.  Left Ear: External ear normal.  Nose: Nose normal.  Mouth/Throat: Oropharynx is clear and moist. No oropharyngeal exudate.  Eyes: Conjunctivae and EOM are normal. Pupils are equal, round, and reactive to light. Right eye exhibits no discharge. Left eye exhibits no discharge. No scleral icterus.  Neck: Normal range of motion. Neck supple. No JVD present. No tracheal deviation present. No thyromegaly present.  Cardiovascular: Normal rate, regular rhythm, normal heart sounds and intact distal pulses.  Exam reveals no gallop and no friction rub.   No murmur heard. Pulmonary/Chest: Effort normal and breath sounds normal. No stridor. No respiratory distress. She has no wheezes. She has no rales. She exhibits no tenderness.  Abdominal: Soft. Bowel sounds are normal. She exhibits no distension and no mass. There is no tenderness. There is no rebound and no guarding.  Musculoskeletal: Normal range of motion. She exhibits no edema or tenderness.  Lymphadenopathy:    She has no cervical adenopathy.  Neurological: She is alert and oriented to person, place, and time. She has normal reflexes. No cranial nerve deficit. She exhibits normal muscle tone. Coordination normal.  Skin: Skin is warm. No rash noted. She is not diaphoretic. No erythema. No pallor.  Psychiatric: She has a normal mood and affect. Her behavior is normal. Judgment and thought content normal.  Vitals reviewed.         Assessment & Plan:  OAB (overactive bladder) - Plan: oxybutynin (DITROPAN) 5 MG tablet  Hyperlipidemia  Routine general medical examination at a health care facility  Patient's labs are outstanding.  I asked the patient to continue pravastatin. Immunizations are up-to-date. Cancer screening is up-to-date. I will defer her Pap smear to her gynecologist. Patient will make sure that her colonoscopy was performed  8 years ago. If so will be due in 2019. Discussed the shingles vaccine and the patient would like to check on the price prior to receiving the vaccine. She does complain of overactive bladder type symptoms primarily at night. She frequently wakes up 5 and 6 times a night to go PE and this keeps her awake. It also occurs during the daytime with sudden urges to go the bathroom however it only causes her a problem at night. Therefore we will try oxybutynin 5 mg by mouth daily at bedtime to see if this will help her sleep better

## 2016-07-16 ENCOUNTER — Telehealth: Payer: Self-pay | Admitting: Family Medicine

## 2016-07-16 MED ORDER — HYDROCODONE-HOMATROPINE 5-1.5 MG/5ML PO SYRP: 5.0000 mL | ORAL_SOLUTION | Freq: Three times a day (TID) | ORAL | Status: AC | PRN

## 2016-07-16 NOTE — Telephone Encounter (Signed)
RX printed, left up front and patient aware to pick up via vm 

## 2016-07-16 NOTE — Telephone Encounter (Signed)
Patient was here yesterday and was supposed to let dr pickard know when last colonscopy was, it was 2009 and also would like to know if she can get cough med for her night cough she says she needs some relief from this  [phone]

## 2016-07-16 NOTE — Telephone Encounter (Signed)
Hycodan 5 ml poq6 hrs prn cough 4 oz.

## 2016-07-22 ENCOUNTER — Telehealth: Payer: Self-pay | Admitting: Family Medicine

## 2016-07-22 ENCOUNTER — Ambulatory Visit (INDEPENDENT_AMBULATORY_CARE_PROVIDER_SITE_OTHER): Payer: BLUE CROSS/BLUE SHIELD | Admitting: Family Medicine

## 2016-07-22 DIAGNOSIS — Z23 Encounter for immunization: Secondary | ICD-10-CM

## 2016-08-12 ENCOUNTER — Other Ambulatory Visit: Payer: Self-pay | Admitting: Physician Assistant

## 2016-08-12 ENCOUNTER — Telehealth: Payer: Self-pay | Admitting: Family Medicine

## 2016-08-12 DIAGNOSIS — E785 Hyperlipidemia, unspecified: Secondary | ICD-10-CM

## 2016-08-12 DIAGNOSIS — E559 Vitamin D deficiency, unspecified: Secondary | ICD-10-CM

## 2016-08-12 NOTE — Telephone Encounter (Signed)
Saw Dr. Melvyn Novas for same thing in 2015, should probably follow up with him again.  I would suggest tramadol 50 mg po q 8 hrs prn cough in case this is nerve irritation.

## 2016-08-12 NOTE — Telephone Encounter (Signed)
Medication refilled per protocol. 

## 2016-08-12 NOTE — Telephone Encounter (Signed)
Patients husband says that she has been seen here several times for a "nagging"cough, would like referral for this if possible  [phone]

## 2016-08-13 MED ORDER — TRAMADOL HCL 50 MG PO TABS: 50.0000 mg | ORAL_TABLET | Freq: Three times a day (TID) | ORAL | 0 refills | Status: DC | PRN

## 2016-08-13 NOTE — Telephone Encounter (Signed)
LMTRC

## 2016-08-13 NOTE — Telephone Encounter (Signed)
Spoke to pt's husband and aware of recommendations and he states that they did go see Dr. Melvyn Novas and he charged them a lot of money for no results. He would prefer not to go see him again. Tramadol called to pharm.

## 2016-08-15 NOTE — Telephone Encounter (Signed)
We could refer to a different pulmonologist ([facility] or [place])

## 2016-08-20 ENCOUNTER — Ambulatory Visit (INDEPENDENT_AMBULATORY_CARE_PROVIDER_SITE_OTHER): Payer: BLUE CROSS/BLUE SHIELD | Admitting: Family Medicine

## 2016-08-20 VITALS — BP 126/68 | HR 60 | Temp 98.7°F | Resp 16 | Ht 63.0 in | Wt 150.0 lb

## 2016-08-20 DIAGNOSIS — R053 Chronic cough: Secondary | ICD-10-CM

## 2016-08-20 DIAGNOSIS — R05 Cough: Secondary | ICD-10-CM

## 2016-08-20 MED ORDER — TRAMADOL HCL 50 MG PO TABS: 50.0000 mg | ORAL_TABLET | Freq: Three times a day (TID) | ORAL | 0 refills | Status: DC | PRN

## 2016-08-20 NOTE — Progress Notes (Signed)
Subjective:    Patient ID: Kim Fitzpatrick, female    DOB: 1956-07-23, 60 y.o.   MRN: [id number]  HPI  03/07/14  The patient has had a persistent dry nonproductive cough for several months. It began in November. She was treated with antibiotics. A chest x-ray was normal. She does continue to experience the cough now unrelenting for 4 months. It tends to be worse at night when she lies down. She does report some postnasal drip and sinus congestion. She denies any acid reflux. She denies any exposure to whooping cough or tuberculosis. She denies any fevers or chills or hemoptysis. She denies any shortness of breath. The coughing comes in spells and spasms. When it happened she can barely catch her breath. Today on examination after coughing she has significant tachycardia to 118 beats per minute. An EKG obtained in the office shows sinus tachycardia with normal intervals, normal axis, no evidence of arrhythmia. No evidence of ischemia.  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 I recommended Flonase 2 sprays each nostril daily, Nexium 40 mg by mouth daily, and Hycodan 1 teaspoon every 8 hours as needed for coughing to break the cycle of coughing.  Also check a chest x-ray as well as blood work to whooping cough. - Bordetella pertussis antibody - DG Chest 2 View; Future - esomeprazole (NEXIUM) 40 MG capsule; Take 1 capsule (40 mg total) by mouth daily at 12 noon.  Dispense: 30 capsule; Refill: 3 - fluticasone (FLONASE) 50 MCG/ACT nasal spray; Place 2 sprays into both nostrils daily.  Dispense: 16 g; Refill: 6 - HYDROcodone-homatropine (HYCODAN) 5-1.5 MG/5ML syrup; Take 5 mLs by mouth every 8 (eight) hours as needed for cough.  Dispense: 120 mL; Refill: 0  03/28/14 Patient is here today for follow up.  The cough is no better. The above workup was normal. She continues to complain of irritation in her upper and in the back of her throat. She denies any  postnasal drip or sinus congestion. The cough has improved that night but continues to happen during the day whenever she talks.  She denies any hemoptysis or weight loss or chest pain or shortness of breath. She had a similar incident several years ago. She saw a pulmonologist in [place]. At that time she will underwent laryngoscopy, 24-hour pH monitoring probe, pulmonary function test and all of the above were  normal and the cough gradually subsided on its own.  AT that time, my plan was:  still believe this represents upper airway cough syndrome from a combination of postnasal drip, laryngo-esophageal reflux, and chronic irritation of the upper airway.  Therefore I recommend pulmonology consultation patient declines at this time. She states she feels like it is gradually improving. I recommend she continue the Nexium. Also recommended that she add QVAR 80 mcg 2 puffs inhaled twice a day to try to calm the upper airway irritation.  Recheck in one month. If no better I would definitely recommend a pulmonology consultation.  08/20/16 Since that time, the patient met with a pulmonologist. Workup was unrevealing. She continues to suffer with chronic cough off and on over the last 2 years. She denies any hemoptysis. She denies any sputum. She denies any weight loss or fevers. She denies any shortness of breath. She does not feel sick. It is more of an irritant cough that will not stop in her upper airways. She asked for referral to a another pulmonologist for second opinion.  I will be glad to oblige the abdomen in the meantime I'll start the patient on tramadol for possible nerve irritation in her upper airways. Since starting the tramadol the patient has not coughed at all. She is extremely pleased with the results on tramadol Past Medical History:  Diagnosis Date  . Allergic rhinitis   . Hyperlipidemia   . Migraines    Current Outpatient Prescriptions on File Prior to Visit  Medication Sig Dispense  Refill  . cetirizine-pseudoephedrine (ZYRTEC-D) 5-120 MG tablet Take 1 tablet by mouth 2 (two) times daily. 30 tablet 11  . cholecalciferol (VITAMIN D) 1000 units tablet Take 1 tablet by mouth  daily 100 tablet 3  . diphenhydramine-acetaminophen (TYLENOL PM) 25-500 MG TABS Take 1 tablet by mouth at bedtime.    Marland Kitchen HYDROcodone-homatropine (HYCODAN) 5-1.5 MG/5ML syrup Take 5 mLs by mouth every 8 (eight) hours as needed for cough. 120 mL 0  . oxybutynin (DITROPAN) 5 MG tablet Take 1 tablet (5 mg total) by mouth at bedtime. 30 tablet 5  . pravastatin (PRAVACHOL) 20 MG tablet Take 1 tablet by mouth  daily 90 tablet 3  . valACYclovir (VALTREX) 1000 MG tablet Take 1 tablet (1,000 mg total) by mouth 3 (three) times daily. 21 tablet 0   No current facility-administered medications on file prior to visit.    No Known Allergies Social History   Social History  . Marital status: Married    Spouse name: N/A  . Number of children: N/A  . Years of education: N/A   Occupational History  . Homemaker    Social History Main Topics  . Smoking status: Never Smoker  . Smokeless tobacco: Never Used  . Alcohol use No  . Drug use: No  . Sexual activity: Yes     Comment: married to [place]   Other Topics Concern  . Not on file   Social History Narrative  . No narrative on file   Family History  Problem Relation Age of Onset  . Osteoporosis Mother   . Hyperlipidemia Mother   . Hypertension Father       Review of Systems  All other systems reviewed and are negative.      Objective:   Physical Exam  Constitutional: She appears well-developed and well-nourished. No distress.  HENT:  Head: Normocephalic and atraumatic.  Right Ear: External ear normal.  Left Ear: External ear normal.  Nose: Mucosal edema and rhinorrhea present.  Mouth/Throat: Oropharynx is clear and moist. No oropharyngeal exudate.  Eyes: Conjunctivae and EOM are normal. Pupils are equal, round, and reactive to light. Right  eye exhibits no discharge. Left eye exhibits no discharge. No scleral icterus.  Neck: Normal range of motion. Neck supple. No JVD present. No thyromegaly present.  Cardiovascular: Regular rhythm and normal heart sounds.  Exam reveals no gallop and no friction rub.   No murmur heard. Pulmonary/Chest: Effort normal and breath sounds normal. No respiratory distress. She has no wheezes. She has no rales. She exhibits no tenderness.  Lymphadenopathy:    She has no cervical adenopathy.  Skin: She is not diaphoretic.  Vitals reviewed.         Assessment & Plan:  I believe the patient has upper airway cough syndrome coupled with nerve irritation in the airways triggering her chronic cough. She can use tramadol 50 mg every 8 hours as needed for cough. At the present time she is only having to take it once a day in the cough is controlled. I recommended  gradually weaning off the medication over the next month and then using only on an as-needed basis

## 2016-09-06 ENCOUNTER — Ambulatory Visit (INDEPENDENT_AMBULATORY_CARE_PROVIDER_SITE_OTHER): Payer: BLUE CROSS/BLUE SHIELD

## 2016-09-06 DIAGNOSIS — Z23 Encounter for immunization: Secondary | ICD-10-CM | POA: Diagnosis not present

## 2016-10-04 ENCOUNTER — Other Ambulatory Visit: Payer: Self-pay | Admitting: Family Medicine

## 2016-10-04 DIAGNOSIS — N3281 Overactive bladder: Secondary | ICD-10-CM

## 2016-10-04 MED ORDER — OXYBUTYNIN CHLORIDE 5 MG PO TABS: 5.0000 mg | ORAL_TABLET | Freq: Every day | ORAL | 3 refills | Status: AC

## 2016-12-02 ENCOUNTER — Telehealth: Payer: Self-pay | Admitting: Family Medicine

## 2016-12-02 DIAGNOSIS — R053 Chronic cough: Secondary | ICD-10-CM

## 2016-12-02 DIAGNOSIS — R05 Cough: Secondary | ICD-10-CM

## 2016-12-02 NOTE — Telephone Encounter (Signed)
[facility] calling asking about recent refill received for Tramadol for cough. They were questioning the use for cough.  Record shows no such refill coming from this office.  Last Rx was in August and pharmacy said this Rx is date in November.  Told him to fax copy of the signed refill to this office.

## 2016-12-03 MED ORDER — TRAMADOL HCL 50 MG PO TABS: 50.0000 mg | ORAL_TABLET | Freq: Three times a day (TID) | ORAL | 0 refills | Status: AC | PRN

## 2016-12-03 NOTE — Telephone Encounter (Signed)
Discussed with [facility] this is for neuropathic chronic cough - ordered and reason faxed back to [facility]

## 2017-10-29 ENCOUNTER — Other Ambulatory Visit: Payer: Self-pay | Admitting: Family Medicine

## 2018-04-06 ENCOUNTER — Other Ambulatory Visit: Payer: Self-pay | Admitting: Family Medicine
